# Patient Record
Sex: Female | Born: 1985 | Race: Black or African American | Hispanic: No | Marital: Married | Smoking: Never smoker
Health system: Southern US, Community
[De-identification: ages and names within clinical notes are randomized; demographics above are authoritative.]

## PROBLEM LIST (undated history)

## (undated) DIAGNOSIS — I10 Essential (primary) hypertension: Secondary | ICD-10-CM

## (undated) HISTORY — PX: HERNIA REPAIR: SHX51

## (undated) HISTORY — PX: TUBAL LIGATION: SHX77

## (undated) HISTORY — PX: HEMORROIDECTOMY: SUR656

---

## 2010-07-31 ENCOUNTER — Emergency Department (INDEPENDENT_AMBULATORY_CARE_PROVIDER_SITE_OTHER): Payer: Self-pay

## 2010-07-31 ENCOUNTER — Emergency Department (HOSPITAL_BASED_OUTPATIENT_CLINIC_OR_DEPARTMENT_OTHER)
Admission: EM | Admit: 2010-07-31 | Discharge: 2010-07-31 | Disposition: A | Discharge status: Home / Self Care | Payer: Self-pay | Attending: Emergency Medicine | Admitting: Emergency Medicine

## 2010-07-31 DIAGNOSIS — A499 Bacterial infection, unspecified: Secondary | ICD-10-CM | POA: Insufficient documentation

## 2010-07-31 DIAGNOSIS — B9689 Other specified bacterial agents as the cause of diseases classified elsewhere: Secondary | ICD-10-CM | POA: Insufficient documentation

## 2010-07-31 DIAGNOSIS — M549 Dorsalgia, unspecified: Secondary | ICD-10-CM

## 2010-07-31 DIAGNOSIS — R109 Unspecified abdominal pain: Secondary | ICD-10-CM | POA: Insufficient documentation

## 2010-07-31 DIAGNOSIS — N760 Acute vaginitis: Secondary | ICD-10-CM | POA: Insufficient documentation

## 2010-07-31 LAB — URINALYSIS, ROUTINE W REFLEX MICROSCOPIC
Bilirubin Urine: NEGATIVE
Hgb urine dipstick: NEGATIVE
Ketones, ur: NEGATIVE mg/dL
Specific Gravity, Urine: 1.029 (ref 1.005–1.030)
Urine Glucose, Fasting: NEGATIVE mg/dL
pH: 7.5 (ref 5.0–8.0)

## 2010-07-31 LAB — WET PREP, GENITAL
Trich, Wet Prep: NONE SEEN
Yeast Wet Prep HPF POC: NONE SEEN

## 2010-08-01 LAB — GC/CHLAMYDIA PROBE AMP, GENITAL
Chlamydia, DNA Probe: NEGATIVE
GC Probe Amp, Genital: NEGATIVE

## 2010-08-31 ENCOUNTER — Emergency Department (INDEPENDENT_AMBULATORY_CARE_PROVIDER_SITE_OTHER): Payer: Self-pay

## 2010-08-31 ENCOUNTER — Emergency Department (HOSPITAL_BASED_OUTPATIENT_CLINIC_OR_DEPARTMENT_OTHER)
Admission: EM | Admit: 2010-08-31 | Discharge: 2010-08-31 | Disposition: A | Discharge status: Home / Self Care | Payer: Self-pay | Attending: Emergency Medicine | Admitting: Emergency Medicine

## 2010-08-31 DIAGNOSIS — S5000XA Contusion of unspecified elbow, initial encounter: Secondary | ICD-10-CM | POA: Insufficient documentation

## 2010-08-31 DIAGNOSIS — M25539 Pain in unspecified wrist: Secondary | ICD-10-CM

## 2011-02-01 ENCOUNTER — Emergency Department (HOSPITAL_BASED_OUTPATIENT_CLINIC_OR_DEPARTMENT_OTHER)
Admission: EM | Admit: 2011-02-01 | Discharge: 2011-02-01 | Disposition: A | Discharge status: Home / Self Care | Payer: Self-pay | Attending: Emergency Medicine | Admitting: Emergency Medicine

## 2011-02-01 ENCOUNTER — Encounter: Payer: Self-pay | Admitting: *Deleted

## 2011-02-01 DIAGNOSIS — N390 Urinary tract infection, site not specified: Secondary | ICD-10-CM | POA: Insufficient documentation

## 2011-02-01 DIAGNOSIS — R109 Unspecified abdominal pain: Secondary | ICD-10-CM | POA: Insufficient documentation

## 2011-02-01 LAB — COMPREHENSIVE METABOLIC PANEL
ALT: 20 U/L (ref 0–35)
AST: 22 U/L (ref 0–37)
Alkaline Phosphatase: 52 U/L (ref 39–117)
CO2: 25 mEq/L (ref 19–32)
Chloride: 102 mEq/L (ref 96–112)
GFR calc Af Amer: >60 mL/min (ref >60)
GFR calc non Af Amer: >60 mL/min (ref >60)
Glucose, Bld: 93 mg/dL (ref 70–99)
Potassium: 3.2 mEq/L — ABNORMAL LOW (ref 3.5–5.1)
Sodium: 136 mEq/L (ref 135–145)
Total Bilirubin: 0.4 mg/dL (ref 0.3–1.2)

## 2011-02-01 LAB — CBC
MCV: 83.3 fL (ref 78.0–100.0)
Platelets: 205 10*3/uL (ref 150–400)
RBC: 3.9 MIL/uL (ref 3.87–5.11)
RDW: 12.4 % (ref 11.5–15.5)
WBC: 5.2 10*3/uL (ref 4.0–10.5)

## 2011-02-01 LAB — URINALYSIS, ROUTINE W REFLEX MICROSCOPIC
Hgb urine dipstick: NEGATIVE
Ketones, ur: NEGATIVE mg/dL
Nitrite: NEGATIVE
Specific Gravity, Urine: 1.036 — ABNORMAL HIGH (ref 1.005–1.030)
Urobilinogen, UA: 1 mg/dL (ref 0.0–1.0)
pH: 5.5 (ref 5.0–8.0)

## 2011-02-01 LAB — DIFFERENTIAL
Basophils Absolute: 0 10*3/uL (ref 0.0–0.1)
Lymphocytes Relative: 17 % (ref 12–46)
Lymphs Abs: 0.9 10*3/uL (ref 0.7–4.0)
Neutro Abs: 3.5 10*3/uL (ref 1.7–7.7)

## 2011-02-01 LAB — URINE MICROSCOPIC-ADD ON

## 2011-02-01 MED ORDER — SODIUM CHLORIDE 0.9 % IV BOLUS (SEPSIS)
1000.0000 mL | Freq: Once | INTRAVENOUS | Status: AC
  Administered 2011-02-01: 1000 mL via INTRAVENOUS

## 2011-02-01 MED ORDER — NITROFURANTOIN MONOHYD MACRO 100 MG PO CAPS: 100.0000 mg | ORAL_CAPSULE | Freq: Two times a day (BID) | ORAL | Status: AC

## 2011-02-01 MED ORDER — ONDANSETRON HCL 4 MG/2ML IJ SOLN: 4.0000 mg | Freq: Once | INTRAMUSCULAR | Status: DC

## 2011-02-01 MED ORDER — ACETAMINOPHEN 325 MG PO TABS
650.0000 mg | ORAL_TABLET | Freq: Once | ORAL | Status: AC
Start: 2011-02-01 — End: 2011-02-01
  Administered 2011-02-01: 650 mg via ORAL
  Filled 2011-02-01: qty 2

## 2011-02-01 NOTE — ED Provider Notes (Signed)
History     CSN: [id number] Arrival date & time: 02/01/2011 12:42 PM  Chief Complaint  Patient presents with  . Abdominal Pain    abdominal pain with diarrhea   Patient is a 25 y.o. female presenting with abdominal pain. The history is provided by the patient. No language interpreter was used.  Abdominal Pain The primary symptoms of the illness include abdominal pain, nausea and diarrhea. The primary symptoms of the illness do not include vomiting, dysuria, vaginal discharge or vaginal bleeding. The current episode started more than 2 days ago. The onset of the illness was gradual. The problem has not changed since onset. The patient states that she believes she is currently not pregnant. The patient has not had a change in bowel habit. Symptoms associated with the illness do not include diaphoresis, urgency, hematuria, frequency or back pain.    History reviewed. No pertinent past medical history.  History reviewed. No pertinent past surgical history.  Family History  Problem Relation Age of Onset  . Hyperlipidemia Mother     History  Substance Use Topics  . Smoking status: Former Smoker    Quit date: 06/16/2008  . Smokeless tobacco: Not on file  . Alcohol Use: Yes     occassionally    OB History    Grav Para Term Preterm Abortions TAB SAB Ect Mult Living   0 0              Review of Systems  Constitutional: Negative for diaphoresis.  Gastrointestinal: Positive for nausea, abdominal pain and diarrhea. Negative for vomiting.  Genitourinary: Negative for dysuria, urgency, frequency, hematuria, vaginal bleeding and vaginal discharge.  Musculoskeletal: Negative for back pain.  All other systems reviewed and are negative.    Physical Exam  BP 109/63  Pulse 77  Temp(Src) 99 F (37.2 C) (Oral)  Resp 20  Ht 5\' 5"  (1.651 m)  Wt 166 lb (75.297 kg)  BMI 27.62 kg/m2  SpO2 100%  LMP 01/15/2011  Physical Exam  Nursing note and vitals reviewed. Constitutional: She is  oriented to person, place, and time. She appears well-developed and well-nourished.  Eyes: Pupils are equal, round, and reactive to light.  Neck: Normal range of motion. Neck supple.  Cardiovascular: Normal rate and regular rhythm.   Pulmonary/Chest: Effort normal and breath sounds normal.  Abdominal: Soft. Bowel sounds are normal.  Musculoskeletal: Normal range of motion.  Neurological: She is alert and oriented to person, place, and time.  Skin: Skin is warm and dry.  Psychiatric: She has a normal mood and affect.    ED Course  Procedures Results for orders placed during the hospital encounter of 02/01/11  URINALYSIS, ROUTINE W REFLEX MICROSCOPIC      Component Value Range   Color, Urine YELLOW  YELLOW    Appearance CLOUDY (*) CLEAR    Specific Gravity, Urine 1.036 (*) 1.005 - 1.030    pH 5.5  5.0 - 8.0    Glucose, UA NEGATIVE  NEGATIVE (mg/dL)   Hgb urine dipstick NEGATIVE  NEGATIVE    Bilirubin Urine SMALL (*) NEGATIVE    Ketones, ur NEGATIVE  NEGATIVE (mg/dL)   Protein, ur NEGATIVE  NEGATIVE (mg/dL)   Urobilinogen, UA 1.0  0.0 - 1.0 (mg/dL)   Nitrite NEGATIVE  NEGATIVE    Leukocytes, UA LARGE (*) NEGATIVE   PREGNANCY, URINE      Component Value Range   Preg Test, Ur NEGATIVE    URINE MICROSCOPIC-ADD ON      Component  Value Range   Squamous Epithelial / LPF MANY (*) RARE    WBC, UA TOO NUMEROUS TO COUNT  <3 (WBC/hpf)   Bacteria, UA MANY (*) RARE    Urine-Other MUCOUS PRESENT    CBC      Component Value Range   WBC 5.2  4.0 - 10.5 (K/uL)   RBC 3.90  3.87 - 5.11 (MIL/uL)   Hemoglobin 11.3 (*) 12.0 - 15.0 (g/dL)   HCT 16.1 (*) 09.6 - 46.0 (%)   MCV 83.3  78.0 - 100.0 (fL)   MCH 29.0  26.0 - 34.0 (pg)   MCHC 34.8  30.0 - 36.0 (g/dL)   RDW 04.5  40.9 - 81.1 (%)   Platelets 205  150 - 400 (K/uL)  DIFFERENTIAL      Component Value Range   Neutrophils Relative 67  43 - 77 (%)   Neutro Abs 3.5  1.7 - 7.7 (K/uL)   Lymphocytes Relative 17  12 - 46 (%)   Lymphs Abs 0.9   0.7 - 4.0 (K/uL)   Monocytes Relative 16 (*) 3 - 12 (%)   Monocytes Absolute 0.8  0.1 - 1.0 (K/uL)   Eosinophils Relative 0  0 - 5 (%)   Eosinophils Absolute 0.0  0.0 - 0.7 (K/uL)   Basophils Relative 0  0 - 1 (%)   Basophils Absolute 0.0  0.0 - 0.1 (K/uL)  COMPREHENSIVE METABOLIC PANEL      Component Value Range   Sodium 136  135 - 145 (mEq/L)   Potassium 3.2 (*) 3.5 - 5.1 (mEq/L)   Chloride 102  96 - 112 (mEq/L)   CO2 25  19 - 32 (mEq/L)   Glucose, Bld 93  70 - 99 (mg/dL)   BUN 12  6 - 23 (mg/dL)   Creatinine, Ser 9.14  0.50 - 1.10 (mg/dL)   Calcium 9.7  8.4 - 78.2 (mg/dL)   Total Protein 7.5  6.0 - 8.3 (g/dL)   Albumin 3.9  3.5 - 5.2 (g/dL)   AST 22  0 - 37 (U/L)   ALT 20  0 - 35 (U/L)   Alkaline Phosphatase 52  39 - 117 (U/L)   Total Bilirubin 0.4  0.3 - 1.2 (mg/dL)   GFR calc non Af Amer >60  >60 (mL/min)   GFR calc Af Amer >60  >60 (mL/min)  LIPASE, BLOOD      Component Value Range   Lipase 19  11 - 59 (U/L)     MDM Will treat pt for a uti with possible pyelo      Teressa Lower, NP 02/01/11 1534

## 2011-02-01 NOTE — ED Notes (Signed)
Upper abdominal pain for 4 days, describes as cramping, associated with diarrhea and decreased appetite.

## 2011-02-02 NOTE — ED Provider Notes (Signed)
History/physical exam/procedure(s) were performed by non-physician practitioner and as supervising physician I was immediately available for consultation/collaboration. I have reviewed all notes and am in agreement with care and plan.   Hilario Quarry, MD 02/02/11 [phone]

## 2014-06-01 ENCOUNTER — Emergency Department (HOSPITAL_BASED_OUTPATIENT_CLINIC_OR_DEPARTMENT_OTHER)
Admission: EM | Admit: 2014-06-01 | Discharge: 2014-06-01 | Disposition: A | Discharge status: Home / Self Care | Payer: Medicaid Other | Attending: Emergency Medicine | Admitting: Emergency Medicine

## 2014-06-01 ENCOUNTER — Encounter (HOSPITAL_BASED_OUTPATIENT_CLINIC_OR_DEPARTMENT_OTHER): Payer: Self-pay | Admitting: *Deleted

## 2014-06-01 DIAGNOSIS — M6208 Separation of muscle (nontraumatic), other site: Secondary | ICD-10-CM | POA: Diagnosis not present

## 2014-06-01 DIAGNOSIS — Z3202 Encounter for pregnancy test, result negative: Secondary | ICD-10-CM | POA: Insufficient documentation

## 2014-06-01 DIAGNOSIS — R109 Unspecified abdominal pain: Secondary | ICD-10-CM | POA: Diagnosis present

## 2014-06-01 DIAGNOSIS — Z87891 Personal history of nicotine dependence: Secondary | ICD-10-CM | POA: Insufficient documentation

## 2014-06-01 LAB — URINALYSIS, ROUTINE W REFLEX MICROSCOPIC
Bilirubin Urine: NEGATIVE
Glucose, UA: NEGATIVE mg/dL
Hgb urine dipstick: NEGATIVE
Ketones, ur: NEGATIVE mg/dL
NITRITE: NEGATIVE
PH: 5.5 (ref 5.0–8.0)
Protein, ur: NEGATIVE mg/dL
SPECIFIC GRAVITY, URINE: 1.025 (ref 1.005–1.030)
UROBILINOGEN UA: 0.2 mg/dL (ref 0.0–1.0)

## 2014-06-01 LAB — URINE MICROSCOPIC-ADD ON

## 2014-06-01 LAB — PREGNANCY, URINE: PREG TEST UR: NEGATIVE

## 2014-06-01 MED ORDER — NAPROXEN 500 MG PO TABS: 500.0000 mg | ORAL_TABLET | Freq: Two times a day (BID) | ORAL | Status: DC

## 2014-06-01 NOTE — Discharge Instructions (Signed)
Please read and follow all provided instructions.  Your diagnoses today include:  1. Diastasis recti    Tests performed today include:  Vital signs. See below for your results today.   Medications prescribed:   Naproxen - anti-inflammatory pain medication  Do not exceed 500mg  naproxen every 12 hours, take with food  You have been prescribed an anti-inflammatory medication or NSAID. Take with food. Take smallest effective dose for the shortest duration needed for your pain. Stop taking if you experience stomach pain or vomiting.   Take any prescribed medications only as directed.  Home care instructions:  Follow any educational materials contained in this packet.  Follow-up instructions: Please follow-up with your primary care provider or see the surgery referral listed in the next 1-2 weeks for further evaluation of your symptoms.   Return instructions:   Please return to the Emergency Department if you experience worsening symptoms.   Return with worsening severe abdominal pain, vomiting.  Please return if you have any other emergent concerns.  Additional Information:  Your vital signs today were: BP 135/89 mmHg   Pulse 72   Temp(Src) 98.7 F (37.1 C) (Oral)   Resp 20   Ht 5\' 5"  (1.651 m)   Wt 198 lb (89.812 kg)   BMI 32.95 kg/m2   SpO2 99%   LMP 04/17/2014 If your blood pressure (BP) was elevated above 135/85 this visit, please have this repeated by your doctor within one month. --------------

## 2014-06-01 NOTE — ED Notes (Signed)
Abdominal pain off and on for a month. States she has a lump in her stomach.

## 2014-06-01 NOTE — ED Provider Notes (Signed)
CSN: [id number]     Arrival date & time 06/01/14  1846 History   First MD Initiated Contact with Patient 06/01/14 1900     Chief Complaint  Patient presents with  . Abdominal Pain     (Consider location/radiation/quality/duration/timing/severity/associated sxs/prior Treatment) HPI Comments: Patient who is 3 and half months postpartum presents with complaint of abdominal swelling and pain. Patient first noted swelling above her belly button after she gave birth to her child. She told this to her doctor and was told that her muscles were stretched and not to worry about it. She feels that the area has become larger. Over the past month she has had intermittent, not daily, dull aching pain to the area. No fever, nausea, vomiting, or diarrhea. No urinary symptoms. No treatments prior to arrival.   Patient is a 28 y.o. female presenting with abdominal pain. The history is provided by the patient.  Abdominal Pain Associated symptoms: no chest pain, no cough, no diarrhea, no dysuria, no fever, no nausea, no sore throat and no vomiting     History reviewed. No pertinent past medical history. Past Surgical History  Procedure Laterality Date  . Tubal ligation     Family History  Problem Relation Age of Onset  . Hyperlipidemia Mother    History  Substance Use Topics  . Smoking status: Former Smoker    Quit date: 06/16/2008  . Smokeless tobacco: Not on file  . Alcohol Use: Yes     Comment: occassionally   OB History    Gravida Para Term Preterm AB TAB SAB Ectopic Multiple Living   0 0             Review of Systems  Constitutional: Negative for fever.  HENT: Negative for rhinorrhea and sore throat.   Eyes: Negative for redness.  Respiratory: Negative for cough.   Cardiovascular: Negative for chest pain.  Gastrointestinal: Positive for abdominal pain and abdominal distention. Negative for nausea, vomiting and diarrhea.  Genitourinary: Negative for dysuria.  Musculoskeletal: Negative  for myalgias.  Skin: Negative for rash.  Neurological: Negative for headaches.      Allergies  Review of patient's allergies indicates no known allergies.  Home Medications   Prior to Admission medications   Not on File   BP 135/89 mmHg  Pulse 72  Temp(Src) 98.7 F (37.1 C) (Oral)  Resp 20  Ht 5\' 5"  (1.651 m)  Wt 198 lb (89.812 kg)  BMI 32.95 kg/m2  SpO2 99%  LMP 04/17/2014   Physical Exam  Constitutional: She appears well-developed and well-nourished.  HENT:  Head: Normocephalic and atraumatic.  Eyes: Conjunctivae are normal. Right eye exhibits no discharge. Left eye exhibits no discharge.  Neck: Normal range of motion. Neck supple.  Cardiovascular: Normal rate, regular rhythm and normal heart sounds.   Pulmonary/Chest: Effort normal and breath sounds normal.  Abdominal: Soft. There is no tenderness. There is no rebound and no guarding.  Patient has a bulge superior to the umbilicus which appears most consistent with diastases recti. This is worse with patient standing and with her lifting her shoulders up off the bed while lying flat. When she relaxes there is a small protrusion in the right upper abdomen which is readily reduced.  Neurological: She is alert.  Skin: Skin is warm and dry.  Psychiatric: She has a normal mood and affect.  Nursing note and vitals reviewed.   ED Course  Procedures (including critical care time) Labs Review Labs Reviewed  URINALYSIS, ROUTINE W REFLEX  MICROSCOPIC - Abnormal; Notable for the following:    APPearance CLOUDY (*)    Leukocytes, UA LARGE (*)    All other components within normal limits  URINE MICROSCOPIC-ADD ON - Abnormal; Notable for the following:    Squamous Epithelial / LPF FEW (*)    Bacteria, UA FEW (*)    All other components within normal limits  PREGNANCY, URINE    Imaging Review No results found.   EKG Interpretation None       7:35 PM Patient seen and examined.   Vital signs reviewed and are as  follows: BP 135/89 mmHg  Pulse 72  Temp(Src) 98.7 F (37.1 C) (Oral)  Resp 20  Ht 5\' 5"  (1.651 m)  Wt 198 lb (89.812 kg)  BMI 32.95 kg/m2  SpO2 99%  LMP 04/17/2014  Patient counseled on conservative management.   I have given the patient a surgery referral so that she can discuss management and to r/o hernia.   We discussed signs and symptoms of incarceration and strangulation which should cause her to return to the emergency department.  MDM   Final diagnoses:  Diastasis recti   Suspect post-partum diastasis recti based on exam, however I cannot completely dismiss a small ventral hernia on the right side. There are no signs of strangulation or incarceration. Shehas no tenderness on exam at this time. No signs of obstruction. Feel conservative management with appropriate follow-up as indicated.  Asymptomatic pyuria noted, unrelated to current complaint.   Renne CriglerJoshua Kamorie Aldous, PA-C 06/01/14 1938  Richardean Canalavid H Yao, MD 06/04/14 [phone]

## 2014-06-01 NOTE — ED Notes (Signed)
C/o abd pain off and on x 1 month  w knot in abd

## 2016-12-11 ENCOUNTER — Encounter (HOSPITAL_BASED_OUTPATIENT_CLINIC_OR_DEPARTMENT_OTHER): Payer: Self-pay | Admitting: Emergency Medicine

## 2016-12-11 ENCOUNTER — Emergency Department (HOSPITAL_BASED_OUTPATIENT_CLINIC_OR_DEPARTMENT_OTHER)
Admission: EM | Admit: 2016-12-11 | Discharge: 2016-12-11 | Disposition: A | Discharge status: Home / Self Care | Payer: Medicaid Other | Attending: Emergency Medicine | Admitting: Emergency Medicine

## 2016-12-11 DIAGNOSIS — K047 Periapical abscess without sinus: Secondary | ICD-10-CM | POA: Insufficient documentation

## 2016-12-11 DIAGNOSIS — Z87891 Personal history of nicotine dependence: Secondary | ICD-10-CM | POA: Insufficient documentation

## 2016-12-11 MED ORDER — CLINDAMYCIN HCL 150 MG PO CAPS: 450.0000 mg | ORAL_CAPSULE | Freq: Four times a day (QID) | ORAL | 0 refills | Status: DC

## 2016-12-11 MED ORDER — NAPROXEN 500 MG PO TABS: 500.0000 mg | ORAL_TABLET | Freq: Two times a day (BID) | ORAL | 0 refills | Status: DC

## 2016-12-11 NOTE — ED Notes (Signed)
ED Provider at bedside. 

## 2016-12-11 NOTE — ED Provider Notes (Signed)
[facility]   CSN: [id number] Arrival date & time: 12/11/16  0745     History   Chief Complaint Chief Complaint  Patient presents with  . Abscess    HPI Jamie Christensen is a 31 y.o. female.  HPI  Pt comes in with cc of abscess. Pt reports that she developed abscess to her gums few days back. 2 days ago the lesion drained pus, but seems the drainage was not complete. Pt has hx of dental problems. She doesn't recall any specific trauma to the gums /tooth. Pt is healthy and not immunocompromised. Pt has no n/v/f/c. Pt is to see dentist next week.  History reviewed. No pertinent past medical history.  There are no active problems to display for this patient.   Past Surgical History:  Procedure Laterality Date  . HERNIA REPAIR    . TUBAL LIGATION      OB History    Gravida Para Term Preterm AB Living   0 0           SAB TAB Ectopic Multiple Live Births                   Home Medications    Prior to Admission medications   Medication Sig Start Date End Date Taking? Authorizing Provider  clindamycin (CLEOCIN) 150 MG capsule Take 3 capsules (450 mg total) by mouth 4 (four) times daily. 12/11/16   Derwood Kaplan, MD  naproxen (NAPROSYN) 500 MG tablet Take 1 tablet (500 mg total) by mouth 2 (two) times daily. 12/11/16   Derwood Kaplan, MD    Family History Family History  Problem Relation Age of Onset  . Hyperlipidemia Mother     Social History Social History  Substance Use Topics  . Smoking status: Former Smoker    Quit date: 06/16/2008  . Smokeless tobacco: Never Used  . Alcohol use Yes     Comment: occassionally     Allergies   Patient has no known allergies.   Review of Systems Review of Systems  HENT: Positive for dental problem.   Skin: Positive for rash.     Physical Exam Updated Vital Signs BP 135/86 (BP Location: Left Arm)   Pulse 80   Temp 98.8 F (37.1 C) (Oral)   Resp 18   Ht 5\' 5"  (1.651 m)   Wt 79.8 kg  (176 lb)   LMP 11/16/2016   SpO2 100%   BMI 29.29 kg/m   Physical Exam  Constitutional: She is oriented to person, place, and time. She appears well-developed.  HENT:  Head: Normocephalic and atraumatic.  The gingiva overlying teeth 5-7 has erythema and fluctuance.   Eyes: EOM are normal.  Neck: Normal range of motion. Neck supple.  Cardiovascular: Normal rate.   Pulmonary/Chest: Effort normal.  Abdominal: Bowel sounds are normal.  Neurological: She is alert and oriented to person, place, and time.  Skin: Skin is warm and dry.  Nursing note and vitals reviewed.    ED Treatments / Results  Labs (all labs ordered are listed, but only abnormal results are displayed) Labs Reviewed - No data to display  EKG  EKG Interpretation None       Radiology No results found.  Procedures Procedures (including critical care time)  Medications Ordered in ED Medications - No data to display   Initial Impression / Assessment and Plan / ED Course  I have reviewed the triage vital signs and the nursing notes.  Pertinent labs & imaging  results that were available during my care of the patient were reviewed by me and considered in my medical decision making (see chart for details).     Pt comes in with abscess to the gums. I am not convinced that the etiology is dental in nature - but I dont think her seeing a dentist next week would be detrimental either.  Pt has a small area of fluctuance within the gingival erythema between tooth 5-7. It seems like the drainage that occurred on Tuesday spontaneously has helped reduce the amount of fluctuance. We gave the patient the option for us to put in a small cut with a blade and start antibiotics vs. Antibiotics alone and for her to return if the symptoms get worse. Pt chose the latter. WE will start her on clinda given there is an abscess.   Final Clinical Impressions(s) / ED Diagnoses   Final diagnoses:  Dental abscess    New  Prescriptions Discharge Medication List as of 12/11/2016  8:35 AM    START taking these medications   Details  clindamycin (CLEOCIN) 150 MG capsule Take 3 capsules (450 mg total) by mouth 4 (four) times daily., Starting Thu 12/11/2016, Print         Derwood KaplanNanavati, Jelina Paulsen, MD 12/11/16 [phone]

## 2016-12-11 NOTE — ED Triage Notes (Signed)
Abscess to inside of upper lip. Pt reports it drained on Tuesday but pain persists.

## 2016-12-11 NOTE — Discharge Instructions (Signed)
You have a dental abscess, and it has affected the gums. We recommend taking the antibiotics prescribed and seeing a dentist next week. Return to the ER if the abscess is getting larger. Maintain good oral hygiene for now.

## 2017-02-13 ENCOUNTER — Emergency Department (HOSPITAL_BASED_OUTPATIENT_CLINIC_OR_DEPARTMENT_OTHER)
Admission: EM | Admit: 2017-02-13 | Discharge: 2017-02-13 | Disposition: A | Discharge status: Home / Self Care | Payer: Medicaid Other | Attending: Emergency Medicine | Admitting: Emergency Medicine

## 2017-02-13 ENCOUNTER — Encounter (HOSPITAL_BASED_OUTPATIENT_CLINIC_OR_DEPARTMENT_OTHER): Payer: Self-pay | Admitting: Emergency Medicine

## 2017-02-13 DIAGNOSIS — R51 Headache: Secondary | ICD-10-CM

## 2017-02-13 DIAGNOSIS — Z87891 Personal history of nicotine dependence: Secondary | ICD-10-CM | POA: Insufficient documentation

## 2017-02-13 DIAGNOSIS — I10 Essential (primary) hypertension: Secondary | ICD-10-CM | POA: Insufficient documentation

## 2017-02-13 DIAGNOSIS — Z79899 Other long term (current) drug therapy: Secondary | ICD-10-CM | POA: Insufficient documentation

## 2017-02-13 DIAGNOSIS — R519 Headache, unspecified: Secondary | ICD-10-CM

## 2017-02-13 HISTORY — DX: Essential (primary) hypertension: I10

## 2017-02-13 NOTE — ED Triage Notes (Signed)
Patient states that she has been on new BP medications x 1 month. Today she felt dizzy and lightheaded and had a HA so she took her BP and it was elevated

## 2017-02-13 NOTE — Discharge Instructions (Signed)
Please read and follow all provided instructions.  Your diagnoses today include:  1. Hypertension, unspecified type   2. Acute nonintractable headache, unspecified headache type     Tests performed today include: Vital signs. See below for your results today.   Medications prescribed:  Take as prescribed   Home care instructions:  Follow any educational materials contained in this packet.  Follow-up instructions: Please follow-up with your primary care provider for further evaluation of symptoms and treatment   Return instructions:  Please return to the Emergency Department if you do not get better, if you get worse, or new symptoms OR  - Fever (temperature greater than 101.33F)  - Bleeding that does not stop with holding pressure to the area    -Severe pain (please note that you may be more sore the day after your accident)  - Chest Pain  - Difficulty breathing  - Severe nausea or vomiting  - Inability to tolerate food and liquids  - Passing out  - Skin becoming red around your wounds  - Change in mental status (confusion or lethargy)  - New numbness or weakness    Please return if you have any other emergent concerns.  Additional Information:  Your vital signs today were: BP (!) 126/93    Pulse (!) 59    Temp 98.7 F (37.1 C) (Oral)    Resp 13    Ht 5\' 5"  (1.651 m)    Wt 81.6 kg (180 lb)    LMP 02/13/2017    SpO2 100%    BMI 29.95 kg/m  If your blood pressure (BP) was elevated above 135/85 this visit, please have this repeated by your doctor within one month. ---------------

## 2017-02-13 NOTE — ED Provider Notes (Signed)
[facility] Note   CSN: [id number] Arrival date & time: 02/13/17  1056     History   Chief Complaint Chief Complaint  Patient presents with  . Headache    HPI Jamie Christensen is a 31 y.o. female.  HPI  31 y.o. female with a hx of HTN, presents to the Emergency Department today due to headache with dizziness. Notes being diagnosed with HTN x 1 month ago and started on BP medication (Lisinopril 5mg ). Pt states she noticed BP 140/100s early this morning. Associated headache. Took BP med. Pt mother wanted pt to come to ED for evaluation. In ED. BP 126/93. Headache resolving. No numbness/tingling. No weakness. No dizziness. No CP/SOB. No N/V. No pain currently. No fevers. No neck stiffness. No other symptoms noted.    Past Medical History:  Diagnosis Date  . Hypertension     There are no active problems to display for this patient.   Past Surgical History:  Procedure Laterality Date  . HERNIA REPAIR    . TUBAL LIGATION      OB History    Gravida Para Term Preterm AB Living   0 0           SAB TAB Ectopic Multiple Live Births                   Home Medications    Prior to Admission medications   Medication Sig Start Date End Date Taking? Authorizing Provider  lisinopril (PRINIVIL,ZESTRIL) 20 MG tablet Take 5 mg by mouth daily.   Yes [provider]  clindamycin (CLEOCIN) 150 MG capsule Take 3 capsules (450 mg total) by mouth 4 (four) times daily. 12/11/16   Derwood KaplanNanavati, Ankit, MD  naproxen (NAPROSYN) 500 MG tablet Take 1 tablet (500 mg total) by mouth 2 (two) times daily. 12/11/16   Derwood KaplanNanavati, Ankit, MD    Family History Family History  Problem Relation Age of Onset  . Hyperlipidemia Mother     Social History Social History  Substance Use Topics  . Smoking status: Former Smoker    Quit date: 06/16/2008  . Smokeless tobacco: Never Used  . Alcohol use Yes     Comment: occassionally     Allergies   Patient has no known  allergies.   Review of Systems Review of Systems ROS reviewed and all are negative for acute change except as noted in the HPI.  Physical Exam Updated Vital Signs BP (!) 141/101 (BP Location: Left Arm)   Pulse 72   Temp 98.7 F (37.1 C) (Oral)   Resp 18   Ht 5\' 5"  (1.651 m)   Wt 81.6 kg (180 lb)   LMP 02/13/2017   SpO2 100%   BMI 29.95 kg/m   Physical Exam  Constitutional: She is oriented to person, place, and time. Vital signs are normal. She appears well-developed and well-nourished. No distress.  HENT:  Head: Normocephalic and atraumatic.  Right Ear: Hearing, tympanic membrane, external ear and ear canal normal.  Left Ear: Hearing, tympanic membrane, external ear and ear canal normal.  Nose: Nose normal.  Mouth/Throat: Uvula is midline, oropharynx is clear and moist and mucous membranes are normal. No trismus in the jaw. No oropharyngeal exudate, posterior oropharyngeal erythema or tonsillar abscesses.  Eyes: Pupils are equal, round, and reactive to light. Conjunctivae and EOM are normal.  Neck: Normal range of motion. Neck supple. No tracheal deviation present.  Cardiovascular: Normal rate, regular rhythm, S1 normal, S2 normal, normal heart sounds, intact  distal pulses and normal pulses.   Pulmonary/Chest: Effort normal and breath sounds normal. No respiratory distress. She has no decreased breath sounds. She has no wheezes. She has no rhonchi. She has no rales.  Abdominal: Normal appearance and bowel sounds are normal. There is no tenderness.  Musculoskeletal: Normal range of motion.  Neurological: She is alert and oriented to person, place, and time. She has normal strength. No cranial nerve deficit or sensory deficit.  Cranial Nerves:  II: Pupils equal, round, reactive to light III,IV, VI: ptosis not present, extra-ocular motions intact bilaterally  V,VII: smile symmetric, facial light touch sensation equal VIII: hearing grossly normal bilaterally  IX,X: midline uvula  rise  XI: bilateral shoulder shrug equal and strong XII: midline tongue extension  Skin: Skin is warm and dry.  Psychiatric: She has a normal mood and affect. Her speech is normal and behavior is normal. Thought content normal.  Nursing note and vitals reviewed.    ED Treatments / Results  Labs (all labs ordered are listed, but only abnormal results are displayed) Labs Reviewed - No data to display  EKG  EKG Interpretation None       Radiology No results found.  Procedures Procedures (including critical care time)  Medications Ordered in ED Medications - No data to display   Initial Impression / Assessment and Plan / ED Course  I have reviewed the triage vital signs and the nursing notes.  Pertinent labs & imaging results that were available during my care of the patient were reviewed by me and considered in my medical decision making (see chart for details).  Final Clinical Impressions(s) / ED Diagnoses     {I have reviewed the relevant previous healthcare records.  {I obtained HPI from historian.   ED Course:  Assessment: Pt is a 31 y.o. female with a hx of HTN, presents to the Emergency Department today due to headache with dizziness. Notes being diagnosed with HTN x 1 month ago and started on BP medication (Lisinopril 5mg ). Pt states she noticed BP 140/100s early this morning. Associated headache. Took BP med. Pt mother wanted pt to come to ED for evaluation. In ED. BP 126/93. Headache resolving. No numbness/tingling. No weakness. No dizziness. No CP/SOB. No N/V. No pain currently. No fevers. No neck stiffness. . On exam, pt in NAD. Nontoxic/nonseptic appearing. VSS. Afebrile. Lungs CTA. Heart RRR. Abdomen nontender soft. BP controlledCounseled pt to wait for BP med to take effect. Encouraged close follow up to PCP. Log BPs. Increase exercise. Counseled on DASH diet.. Plan is to DC home with follow up to PCP. At time of discharge, Patient is in no acute distress. Vital  Signs are stable. Patient is able to ambulate. Patient able to tolerate PO.   Disposition/Plan:  DC Home Additional Verbal discharge instructions given and discussed with patient.  Pt Instructed to f/u with PCP in the next week for evaluation and treatment of symptoms. Return precautions given Pt acknowledges and agrees with plan  Supervising Physician Raeford Razor, MD  Final diagnoses:  Hypertension, unspecified type  Acute nonintractable headache, unspecified headache type    New Prescriptions New Prescriptions   No medications on file     Audry Pili, Cordelia Poche 02/13/17 1153    Raeford Razor, MD 02/18/17 [phone]

## 2017-03-27 ENCOUNTER — Emergency Department (HOSPITAL_BASED_OUTPATIENT_CLINIC_OR_DEPARTMENT_OTHER)
Admission: EM | Admit: 2017-03-27 | Discharge: 2017-03-27 | Disposition: A | Discharge status: Home / Self Care | Payer: Self-pay | Attending: Emergency Medicine | Admitting: Emergency Medicine

## 2017-03-27 ENCOUNTER — Encounter (HOSPITAL_BASED_OUTPATIENT_CLINIC_OR_DEPARTMENT_OTHER): Payer: Self-pay | Admitting: Emergency Medicine

## 2017-03-27 ENCOUNTER — Emergency Department (HOSPITAL_COMMUNITY)
Admission: EM | Admit: 2017-03-27 | Discharge: 2017-03-28 | Disposition: A | Discharge status: Home / Self Care | Payer: No Typology Code available for payment source | Attending: Emergency Medicine | Admitting: Emergency Medicine

## 2017-03-27 ENCOUNTER — Emergency Department (HOSPITAL_BASED_OUTPATIENT_CLINIC_OR_DEPARTMENT_OTHER): Payer: Self-pay

## 2017-03-27 ENCOUNTER — Emergency Department (HOSPITAL_COMMUNITY): Payer: No Typology Code available for payment source

## 2017-03-27 DIAGNOSIS — R109 Unspecified abdominal pain: Secondary | ICD-10-CM | POA: Diagnosis not present

## 2017-03-27 DIAGNOSIS — Z87891 Personal history of nicotine dependence: Secondary | ICD-10-CM | POA: Insufficient documentation

## 2017-03-27 DIAGNOSIS — I10 Essential (primary) hypertension: Secondary | ICD-10-CM | POA: Diagnosis not present

## 2017-03-27 DIAGNOSIS — R0789 Other chest pain: Secondary | ICD-10-CM | POA: Insufficient documentation

## 2017-03-27 DIAGNOSIS — Y92410 Unspecified street and highway as the place of occurrence of the external cause: Secondary | ICD-10-CM | POA: Diagnosis not present

## 2017-03-27 DIAGNOSIS — Y9389 Activity, other specified: Secondary | ICD-10-CM | POA: Insufficient documentation

## 2017-03-27 DIAGNOSIS — Z79899 Other long term (current) drug therapy: Secondary | ICD-10-CM | POA: Diagnosis not present

## 2017-03-27 DIAGNOSIS — Y999 Unspecified external cause status: Secondary | ICD-10-CM | POA: Diagnosis not present

## 2017-03-27 DIAGNOSIS — M542 Cervicalgia: Secondary | ICD-10-CM | POA: Diagnosis not present

## 2017-03-27 DIAGNOSIS — R0602 Shortness of breath: Secondary | ICD-10-CM | POA: Insufficient documentation

## 2017-03-27 DIAGNOSIS — R079 Chest pain, unspecified: Secondary | ICD-10-CM | POA: Insufficient documentation

## 2017-03-27 LAB — TROPONIN I: Troponin I: <0.03 ng/mL (ref <0.03)

## 2017-03-27 LAB — I-STAT CHEM 8, ED
BUN: 12 mg/dL (ref 6–20)
Calcium, Ion: 1.16 mmol/L (ref 1.15–1.40)
Chloride: 104 mmol/L (ref 101–111)
Creatinine, Ser: 0.8 mg/dL (ref 0.44–1.00)
Glucose, Bld: 92 mg/dL (ref 65–99)
HEMATOCRIT: 39 % (ref 36.0–46.0)
HEMOGLOBIN: 13.3 g/dL (ref 12.0–15.0)
POTASSIUM: 3.6 mmol/L (ref 3.5–5.1)
Sodium: 140 mmol/L (ref 135–145)
TCO2: 25 mmol/L (ref 22–32)

## 2017-03-27 LAB — BASIC METABOLIC PANEL
ANION GAP: 6 (ref 5–15)
BUN: 12 mg/dL (ref 6–20)
CHLORIDE: 106 mmol/L (ref 101–111)
CO2: 25 mmol/L (ref 22–32)
Calcium: 9 mg/dL (ref 8.9–10.3)
Creatinine, Ser: 0.85 mg/dL (ref 0.44–1.00)
GFR calc non Af Amer: >60 mL/min (ref >60)
Glucose, Bld: 87 mg/dL (ref 65–99)
POTASSIUM: 3.6 mmol/L (ref 3.5–5.1)
Sodium: 137 mmol/L (ref 135–145)

## 2017-03-27 LAB — CBC
HEMATOCRIT: 37.8 % (ref 36.0–46.0)
HEMOGLOBIN: 12.8 g/dL (ref 12.0–15.0)
MCH: 28.7 pg (ref 26.0–34.0)
MCHC: 33.9 g/dL (ref 30.0–36.0)
MCV: 84.8 fL (ref 78.0–100.0)
Platelets: 228 10*3/uL (ref 150–400)
RBC: 4.46 MIL/uL (ref 3.87–5.11)
RDW: 12.5 % (ref 11.5–15.5)
WBC: 5.6 10*3/uL (ref 4.0–10.5)

## 2017-03-27 LAB — I-STAT BETA HCG BLOOD, ED (MC, WL, AP ONLY)

## 2017-03-27 LAB — D-DIMER, QUANTITATIVE: D-Dimer, Quant: <0.27 ug/mL-FEU (ref 0.00–0.50)

## 2017-03-27 LAB — CBG MONITORING, ED: Glucose-Capillary: 83 mg/dL (ref 65–99)

## 2017-03-27 LAB — PREGNANCY, URINE: Preg Test, Ur: NEGATIVE

## 2017-03-27 MED ORDER — ASPIRIN 81 MG PO CHEW
324.0000 mg | CHEWABLE_TABLET | Freq: Once | ORAL | Status: AC
  Administered 2017-03-27: 324 mg via ORAL
  Filled 2017-03-27: qty 4

## 2017-03-27 MED ORDER — IOPAMIDOL (ISOVUE-300) INJECTION 61%
INTRAVENOUS | Status: AC
  Administered 2017-03-27: 100 mL
  Filled 2017-03-27: qty 100

## 2017-03-27 MED ORDER — MELOXICAM 15 MG PO TABS: 15.0000 mg | ORAL_TABLET | Freq: Every day | ORAL | 0 refills | Status: DC

## 2017-03-27 MED ORDER — SODIUM CHLORIDE 0.9 % IV BOLUS (SEPSIS)
500.0000 mL | Freq: Once | INTRAVENOUS | Status: AC
  Administered 2017-03-27: 500 mL via INTRAVENOUS

## 2017-03-27 MED ORDER — ONDANSETRON HCL 4 MG/2ML IJ SOLN
4.0000 mg | Freq: Once | INTRAMUSCULAR | Status: DC
  Filled 2017-03-27: qty 2

## 2017-03-27 MED ORDER — TRAMADOL HCL 50 MG PO TABS: 50.0000 mg | ORAL_TABLET | Freq: Four times a day (QID) | ORAL | 0 refills | Status: DC | PRN

## 2017-03-27 MED ORDER — ONDANSETRON 4 MG PO TBDP: ORAL_TABLET | ORAL | 0 refills | Status: AC

## 2017-03-27 NOTE — ED Notes (Signed)
Bed: WA04 Expected date:  Expected time:  Means of arrival:  Comments: Triage 1 

## 2017-03-27 NOTE — ED Triage Notes (Signed)
Left sided chest pain for one week.    Some sob at times.  No diaphoresis or N/V.  Pain feels like throbbing, non-radiating.

## 2017-03-27 NOTE — ED Provider Notes (Signed)
[facility]   CSN: [id number] Arrival date & time: 03/27/17  4098     History   Chief Complaint Chief Complaint  Patient presents with  . Chest Pain    HPI Jamie Christensen is a 31 y.o. female.He presents emergency Department with chief complaint of left-sided chest pain. It has been ongoing for 1 week. She states that it is worse with certain movements such as twisting her thorax to the left, or sitting up from a lying position. She has associated shortness of breath which she feels is related to pain with deep breathing. She denies hemoptysis, unilateral leg swelling or pain, recent surgeries or confinement, or use any exogenous estrogens. She does not smoke. She denies having cough, fevers or chills. 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 She does not recall any specific event which might have caused injury. She denies recent viral illnesses, palpitations.  HPI  Past Medical History:  Diagnosis Date  . Hypertension     There are no active problems to display for this patient.   Past Surgical History:  Procedure Laterality Date  . HERNIA REPAIR    . TUBAL LIGATION      OB History    Gravida Para Term Preterm AB Living   0 0           SAB TAB Ectopic Multiple Live Births                   Home Medications    Prior to Admission medications   Medication Sig Start Date End Date Taking? Authorizing Provider  lisinopril (PRINIVIL,ZESTRIL) 20 MG tablet Take 5 mg by mouth daily.    [provider]    Family History Family History  Problem Relation Age of Onset  . Hyperlipidemia Mother     Social History Social History  Substance Use Topics  . Smoking status: Former Smoker    Quit date: 06/16/2008  . Smokeless tobacco: Never Used  . Alcohol use Yes     Comment: occassionally     Allergies   Patient has no known allergies.   Review of Systems Review of Systems Physical Exam  Nursing note and vitals  reviewed. Constitutional: She is oriented to person, place, and time. She appears well-developed and well-nourished. No distress.  HENT:  Head: Normocephalic and atraumatic.  Eyes: Conjunctivae normal and EOM are normal. Pupils are equal, round, and reactive to light. No scleral icterus.  Neck: Normal range of motion.  Cardiovascular: Normal rate, regular rhythm and normal heart sounds.  Exam reveals no gallop and no friction rub.   No murmur heard. Pulmonary/Chest: Effort normal and breath sounds normal. No respiratory distress.  no reproducible chest tenderness. Abdominal: Soft. Bowel sounds are normal. She exhibits no distension and no mass. There is no tenderness. There is no guarding.  Neurological: She is alert and oriented to person, place, and time.  Skin: Skin is warm and dry. She is not diaphoretic.     Physical Exam Updated Vital Signs BP 120/80 (BP Location: Left Arm)   Pulse 69   Temp 98.4 F (36.9 C)   Resp 16   Ht  (1.651 m)   Wt 81.6 kg (180 lb)   LMP 03/09/2017   SpO2 100%   BMI 29.95 kg/m   Physical Exam   ED Treatments / Results  Labs (all labs ordered are listed, but only abnormal results are displayed) Labs Reviewed  BASIC METABOLIC  PANEL  TROPONIN I  CBC  PREGNANCY, URINE  D-DIMER, QUANTITATIVE (NOT AT [facility])  TROPONIN I  CBG MONITORING, ED    EKG  EKG Interpretation  Date/Time:  Friday March 27 2017 09:15:10 EDT Ventricular Rate:  80 PR Interval:  156 QRS Duration: 78 QT Interval:  376 QTC Calculation: 433 R Axis:   26 Text Interpretation:  Normal sinus rhythm Normal ECG since last tracing no significant change Confirmed by Rolan Bucco [phone]) on 03/27/2017 10:37:22 AM       Radiology Dg Chest 2 View  Result Date: 03/27/2017 CLINICAL DATA:  Left side chest pain for 1 week EXAM: CHEST  2 VIEW COMPARISON:  None. FINDINGS: Heart and mediastinal contours are within normal limits. No focal opacities or effusions. No acute bony  abnormality. IMPRESSION: No active cardiopulmonary disease. Electronically Signed   By: Charlett Nose M.D.   On: 03/27/2017 09:28    Procedures Procedures (including critical care time)  Medications Ordered in ED Medications  aspirin chewable tablet 324 mg (324 mg Oral Given 03/27/17 0932)     Initial Impression / Assessment and Plan / ED Course  I have reviewed the triage vital signs and the nursing notes.  Pertinent labs & imaging results that were available during my care of the patient were reviewed by me and considered in my medical decision making (see chart for details).     Patient is to be discharged with recommendation to follow up with PCP in regards to today's hospital visit. Chest pain is not likely of cardiac or pulmonary etiology d/t presentation, perc negative, VSS, no tracheal deviation, no JVD or new murmur, RRR, breath sounds equal bilaterally, EKG without acute abnormalities, negative troponin, and negative CXR.Pain likely musculoskeletal.Return to the ED is CP becomes exertional, associated with diaphoresis or nausea, radiates to left jaw/arm, worsens or becomes concerning in any way. Pt appears reliable for follow up and is agreeable to discharge.     Final Clinical Impressions(s) / ED Diagnoses   Final diagnoses:  Chest wall pain    New Prescriptions New Prescriptions   No medications on file     Arthor Captain, PA-C 03/27/17 1213    Rolan Bucco, MD 03/27/17 1524

## 2017-03-27 NOTE — ED Notes (Signed)
[facility] ED Provider at bedside.

## 2017-03-27 NOTE — Discharge Instructions (Signed)
Follow-up next week if any problems. rest over the next couple days
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
## 2017-03-27 NOTE — Discharge Instructions (Signed)
Your work up was negative today. You have been diagnosed by your caregiver as having chest wall pain. SEEK IMMEDIATE MEDICAL ATTENTION IF: You develop a fever.  Your chest pains become severe or intolerable.  You develop new, unexplained symptoms (problems).  You develop shortness of breath, nausea, vomiting, sweating or feel light headed.  You develop a new cough or you cough up blood.

## 2017-10-03 ENCOUNTER — Emergency Department (HOSPITAL_BASED_OUTPATIENT_CLINIC_OR_DEPARTMENT_OTHER)
Admission: EM | Admit: 2017-10-03 | Discharge: 2017-10-03 | Disposition: A | Discharge status: Home / Self Care | Payer: Self-pay | Attending: Emergency Medicine | Admitting: Emergency Medicine

## 2017-10-03 ENCOUNTER — Encounter (HOSPITAL_BASED_OUTPATIENT_CLINIC_OR_DEPARTMENT_OTHER): Payer: Self-pay | Admitting: Emergency Medicine

## 2017-10-03 ENCOUNTER — Other Ambulatory Visit: Payer: Self-pay

## 2017-10-03 DIAGNOSIS — Z79899 Other long term (current) drug therapy: Secondary | ICD-10-CM | POA: Insufficient documentation

## 2017-10-03 DIAGNOSIS — M722 Plantar fascial fibromatosis: Secondary | ICD-10-CM | POA: Insufficient documentation

## 2017-10-03 DIAGNOSIS — I10 Essential (primary) hypertension: Secondary | ICD-10-CM | POA: Insufficient documentation

## 2017-10-03 DIAGNOSIS — Z87891 Personal history of nicotine dependence: Secondary | ICD-10-CM | POA: Insufficient documentation

## 2017-10-03 NOTE — Discharge Instructions (Addendum)
You may use over-the-counter Motrin (Ibuprofen), Acetaminophen (Tylenol), topical muscle creams such as Arnica, SalonPas, Icy Hot, Bengay, etc. Please stretch, apply heat or cold, and have massage or roll your foot over a firm ball for additional assistance.

## 2017-10-03 NOTE — ED Provider Notes (Signed)
[facility] EMERGENCY DEPARTMENT Provider Note  CSN: [id number] Arrival date & time: 10/03/17 4098  Chief Complaint(s) Foot Pain  HPI Jamie Christensen is a 32 y.o. female   The history is provided by the patient.  Foot Pain  This is a new problem. Episode onset: 3-4 days. The problem occurs constantly. The problem has not changed since onset.Pertinent negatives include no chest pain, no abdominal pain, no headaches and no shortness of breath. The symptoms are aggravated by walking. The symptoms are relieved by rest. Treatments tried: NSAIDs. The treatment provided mild relief.   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  She denies any trauma.  No fevers or chills.  Past Medical History Past Medical History:  Diagnosis Date  . Hypertension    There are no active problems to display for this patient.  Home Medication(s) Prior to Admission medications   Medication Sig Start Date End Date Taking? Authorizing Provider  lisinopril (PRINIVIL,ZESTRIL) 20 MG tablet Take 5 mg by mouth daily.    [provider]  meloxicam (MOBIC) 15 MG tablet Take 1 tablet (15 mg total) by mouth daily. Take 1 daily with food. Patient not taking: Reported on 03/27/2017 03/27/17   Arthor Captain, PA-C  ondansetron (ZOFRAN ODT) 4 MG disintegrating tablet 4mg  ODT q4 hours prn nausea/vomit 03/27/17   Bethann Berkshire, MD  traMADol (ULTRAM) 50 MG tablet Take 1 tablet (50 mg total) by mouth every 6 (six) hours as needed. 03/27/17   Bethann Berkshire, MD                                                                                                                                    Past Surgical History Past Surgical History:  Procedure Laterality Date  . HERNIA REPAIR    . TUBAL LIGATION     Family History Family History  Problem Relation Age of Onset  . Hyperlipidemia Mother     Social History Social History   Tobacco Use  . Smoking status: Former Smoker    Last attempt to  quit: 06/16/2008    Years since quitting: 9.3  . Smokeless tobacco: Never Used  Substance Use Topics  . Alcohol use: Yes    Comment: occassionally  . Drug use: No   Allergies Patient has no known allergies.  Review of Systems Review of Systems  Respiratory: Negative for shortness of breath.   Cardiovascular: Negative for chest pain.  Gastrointestinal: Negative for abdominal pain.  Neurological: Negative for headaches.   All other systems are reviewed and are negative for acute change except as noted in the HPI  Physical Exam Vital Signs  I have reviewed the triage vital signs BP 140/90 (BP Location: Left Arm)   Pulse 76   Temp 98.7 F (37.1 C) (Oral)   Resp 18   Ht 5\' 5"  (1.651 m)   Wt 86.2 kg (190 lb)  LMP 09/26/2017   SpO2 100%   BMI 31.62 kg/m   Physical Exam  Constitutional: She is oriented to person, place, and time. She appears well-developed and well-nourished. No distress.  HENT:  Head: Normocephalic and atraumatic.  Right Ear: External ear normal.  Left Ear: External ear normal.  Nose: Nose normal.  Eyes: Conjunctivae and EOM are normal. No scleral icterus.  Neck: Normal range of motion and phonation normal.  Cardiovascular: Normal rate and regular rhythm.  Pulmonary/Chest: Effort normal. No stridor. No respiratory distress.  Abdominal: She exhibits no distension.  Musculoskeletal: Normal range of motion. She exhibits no edema.       Right foot: There is tenderness. There is normal range of motion, no bony tenderness, no swelling and no deformity.       Left foot: There is tenderness. There is normal range of motion, no bony tenderness, no swelling and no deformity.  Flat footed  Neurological: She is alert and oriented to person, place, and time.  Skin: She is not diaphoretic.  Psychiatric: She has a normal mood and affect. Her behavior is normal.  Vitals reviewed.   ED Results and Treatments Labs (all labs ordered are listed, but only abnormal  results are displayed) Labs Reviewed - No data to display                                                                                                                       EKG  EKG Interpretation  Date/Time:    Ventricular Rate:    PR Interval:    QRS Duration:   QT Interval:    QTC Calculation:   R Axis:     Text Interpretation:        Radiology No results found. Pertinent labs & imaging results that were available during my care of the patient were reviewed by me and considered in my medical decision making (see chart for details).  Medications Ordered in ED Medications - No data to display                                                                                                                                  Procedures Procedures  (including critical care time)  Medical Decision Making / ED Course I have reviewed the nursing notes for this encounter and the patient's prior records (if available in EHR or on provided paperwork).    Presentation is most suspicious  for plantar fasciitis.  Patient denied any trauma concerning for fracture dislocation.  No fevers or chills concerning for infectious process.  Low suspicion for DVT.  Recommended symptomatic management and over-the-counter medications along with appropriate soled shoes for comfort.  Final Clinical Impression(s) / ED Diagnoses Final diagnoses:  Plantar fasciitis, bilateral    Disposition: Discharge  Condition: Good  I have discussed the results, Dx and Tx plan with the patient who expressed understanding and agree(s) with the plan. Discharge instructions discussed at great length. The patient was given strict return precautions who verbalized understanding of the instructions. No further questions at time of discharge.    ED Discharge Orders    None       Follow Up: Primary care provider   in 2-4 weeks, If symptoms do not improve or  worsen     This chart was dictated using voice  recognition software.  Despite best efforts to proofread,  errors can occur which can change the documentation meaning.   Nira Connardama, Shanika Levings Eduardo, MD 10/03/17 1004

## 2017-10-03 NOTE — ED Triage Notes (Signed)
Patient states that she has had pain to her bilateral feet x 2 -3 days  - she reports that she has been standing on her feet longer than usual. The patient reports that pain shoots up her feet into her legs

## 2018-11-03 ENCOUNTER — Encounter (HOSPITAL_BASED_OUTPATIENT_CLINIC_OR_DEPARTMENT_OTHER): Payer: Self-pay

## 2018-11-03 ENCOUNTER — Other Ambulatory Visit: Payer: Self-pay

## 2018-11-03 ENCOUNTER — Emergency Department (HOSPITAL_BASED_OUTPATIENT_CLINIC_OR_DEPARTMENT_OTHER)
Admission: EM | Admit: 2018-11-03 | Discharge: 2018-11-03 | Disposition: A | Discharge status: Home / Self Care | Payer: Medicaid Other | Attending: Emergency Medicine | Admitting: Emergency Medicine

## 2018-11-03 DIAGNOSIS — I10 Essential (primary) hypertension: Secondary | ICD-10-CM | POA: Insufficient documentation

## 2018-11-03 DIAGNOSIS — J029 Acute pharyngitis, unspecified: Secondary | ICD-10-CM | POA: Diagnosis present

## 2018-11-03 DIAGNOSIS — Z87891 Personal history of nicotine dependence: Secondary | ICD-10-CM | POA: Insufficient documentation

## 2018-11-03 DIAGNOSIS — Z79899 Other long term (current) drug therapy: Secondary | ICD-10-CM | POA: Insufficient documentation

## 2018-11-03 LAB — GROUP A STREP BY PCR: Group A Strep by PCR: NOT DETECTED

## 2018-11-03 MED ORDER — LIDOCAINE VISCOUS HCL 2 % MT SOLN: 15.0000 mL | Freq: Once | OROMUCOSAL | Status: DC

## 2018-11-03 MED ORDER — LIDOCAINE VISCOUS HCL 2 % MT SOLN: 15.0000 mL | OROMUCOSAL | 0 refills | Status: DC | PRN

## 2018-11-03 NOTE — Discharge Instructions (Addendum)
You have been diagnosed today with viral sore throat.  At this time there does not appear to be the presence of an emergent medical condition, however there is always the potential for conditions to change. Please read and follow the below instructions.  Please return to the Emergency Department immediately for any new or worsening symptoms or if your symptoms do not improve within 3 days. Please be sure to follow up with your Primary Care Provider within one week regarding your visit today; please call their office to schedule an appointment even if you are feeling better for a follow-up visit. You may use the viscous lidocaine prescribed to you today to help with your sore throat.  Do not swallow this medication, rinse and spit.  You may also use over-the-counter Tylenol as directed on the packaging to help with your sore throat.  Over-the-counter throat lozenges may be helpful as well.  Please drink plenty water to avoid dehydration and return for any new or worsening symptoms. As we discussed based on current guidelines testing for COVID-19 is not indicated here in the emergency department at this time.  It is possible that your sore throat may be caused by the COVID-19 virus so we advised that you self quarantine for 2 weeks to avoid potential spread.  Follow-up with your primary care provider via telephone visit within 2-3 days for reevaluation.  Your primary care provider may also set you up with outpatient/drive-through [id number] testing if indicated and available in the future.  Return to emerge department immediately for any new or worsening symptoms.  Get help right away if: You feel pain or pressure in your chest. You have shortness of breath. You faint or feel like you will faint. You keep throwing up (vomiting). You feel confused. Get help right away if: You have trouble breathing. You cannot swallow fluids, soft foods, or your saliva. You have swelling in your throat or neck that gets  worse. You keep feeling sick to your stomach (nauseous). You keep throwing up (vomiting). Please read the additional information packets attached to your discharge summary.  Do not take your medicine if  develop an itchy rash, swelling in your mouth or lips, or difficulty breathing.  Seek emergency medical attention/call 911 if this occurs.

## 2018-11-03 NOTE — ED Triage Notes (Signed)
C/o sore throat x 2 days-NAD-steady gait 

## 2018-11-03 NOTE — ED Notes (Addendum)
Assumed pt care at discharge, pt verbalizes understanding of dc instructions and denies any further needs at this time

## 2018-11-03 NOTE — ED Provider Notes (Signed)
[facility] EMERGENCY DEPARTMENT Provider Note   CSN: [id number] Arrival date & time: 11/03/18  1634    History   Chief Complaint Chief Complaint  Patient presents with  . Sore Throat    HPI Jamie Christensen is a 33 y.o. female with history of hypertension, tubal ligation and hernia repair presenting today for sore throat.  Patient reports that 2 days ago she developed a sore throat that she describes as a scratchy sensation on both sides of her throat worse on left than has been constant since onset moderate in intensity worsened with swallowing and slightly relieved with NyQuil.  Additionally patient reports nasal congestion x2 days.  Finally patient states that she has had a mild intermittent nonproductive cough that she feels is caused by her scratchy throat.  Patient denies any fevers/chills, chest pain/shortness of breath, headache, neck pain/swelling, hemoptysis, sputum production, abdominal pain, nausea/vomiting, diarrhea, extremity pain/swelling or any additional concerns.  Patient reports that she has been eating and drinking normally today, had lunch without difficulty swallowing.     HPI  Past Medical History:  Diagnosis Date  . Hypertension     There are no active problems to display for this patient.   Past Surgical History:  Procedure Laterality Date  . HERNIA REPAIR    . TUBAL LIGATION       OB History    Gravida  0   Para  0   Term      Preterm      AB      Living        SAB      TAB      Ectopic      Multiple      Live Births               Home Medications    Prior to Admission medications   Medication Sig Start Date End Date Taking? Authorizing Provider  lidocaine (XYLOCAINE) 2 % solution Use as directed 15 mLs in the mouth or throat as needed for mouth pain (Do NOT swallow). 11/03/18   Harlene Salts A, PA-C  lisinopril (PRINIVIL,ZESTRIL) 20 MG tablet Take 5 mg by mouth daily.    [provider]  meloxicam  (MOBIC) 15 MG tablet Take 1 tablet (15 mg total) by mouth daily. Take 1 daily with food. Patient not taking: Reported on 03/27/2017 03/27/17   Arthor Captain, PA-C  ondansetron (ZOFRAN ODT) 4 MG disintegrating tablet  ODT q4 hours prn nausea/vomit 03/27/17   Bethann Berkshire, MD  traMADol (ULTRAM) 50 MG tablet Take 1 tablet (50 mg total) by mouth every 6 (six) hours as needed. 03/27/17   Bethann Berkshire, MD    Family History Family History  Problem Relation Age of Onset  . Hyperlipidemia Mother     Social History Social History   Tobacco Use  . Smoking status: Former Smoker    Last attempt to quit: 06/16/2008    Years since quitting: 10.3  . Smokeless tobacco: Never Used  Substance Use Topics  . Alcohol use: Yes    Comment: occassionally  . Drug use: No     Allergies   Patient has no known allergies.   Review of Systems Review of Systems  Constitutional: Negative.  Negative for chills and fever.  HENT: Positive for congestion and sore throat. Negative for dental problem, ear pain, facial swelling, sinus pressure, sinus pain, trouble swallowing and voice change.   Eyes: Negative.  Negative for visual disturbance.  Respiratory: Positive for cough (mild non productive, attributes to sore throat). Negative for choking, chest tightness, shortness of breath and wheezing.   Cardiovascular: Negative.  Negative for chest pain, palpitations and leg swelling.  Gastrointestinal: Negative.  Negative for abdominal pain, diarrhea, nausea and vomiting.  Musculoskeletal: Negative.  Negative for neck pain.  Neurological: Negative.  Negative for headaches.   Physical Exam Updated Vital Signs BP (!) 142/101 (BP Location: Left Arm)   Pulse 80   Temp 98.4 F (36.9 C) (Oral)   Resp 16   Ht 5\' 5"  (1.651 m)   Wt 94.3 kg   LMP 10/22/2018   SpO2 99%   BMI 34.61 kg/m   Physical Exam Constitutional:      General: She is not in acute distress.    Appearance: Normal appearance. She is  well-developed. She is not ill-appearing or diaphoretic.  HENT:     Head: Normocephalic and atraumatic.     Jaw: There is normal jaw occlusion. No trismus.     Right Ear: Tympanic membrane, ear canal and external ear normal.     Left Ear: Tympanic membrane, ear canal and external ear normal.     Nose: Rhinorrhea present. Rhinorrhea is clear.     Right Nostril: No epistaxis.     Left Nostril: No epistaxis.     Right Sinus: No maxillary sinus tenderness or frontal sinus tenderness.     Left Sinus: No maxillary sinus tenderness or frontal sinus tenderness.     Mouth/Throat:     Lips: Pink.     Mouth: Mucous membranes are moist.     Pharynx: Oropharynx is clear. Uvula midline.     Comments: Patient with posterior oropharynx cobblestoning consistent with postnasal drip.  The patient has normal phonation and is in control of secretions. No stridor.  Midline uvula without edema. Soft palate rises symmetrically. No tonsillar erythema, swelling or exudates. Tongue protrusion is normal, floor of mouth is soft. No trismus. No creptius on neck palpation. No gingival erythema or fluctuance noted. Mucus membranes moist. No pallor noted. Eyes:     General: Vision grossly intact. Gaze aligned appropriately.     Extraocular Movements: Extraocular movements intact.     Conjunctiva/sclera: Conjunctivae normal.     Pupils: Pupils are equal, round, and reactive to light.     Comments: No pain with extraocular motion  Neck:     Musculoskeletal: Full passive range of motion without pain, normal range of motion and neck supple.     Trachea: Trachea and phonation normal. No tracheal tenderness or tracheal deviation.     Meningeal: Brudzinski's sign absent.  Cardiovascular:     Rate and Rhythm: Normal rate and regular rhythm.     Heart sounds: Normal heart sounds.  Pulmonary:     Effort: Pulmonary effort is normal. No accessory muscle usage or respiratory distress.     Breath sounds: Normal breath sounds and  air entry. No stridor. No wheezing or rhonchi.  Abdominal:     General: There is no distension.     Palpations: Abdomen is soft.     Tenderness: There is no abdominal tenderness. There is no guarding or rebound.  Musculoskeletal: Normal range of motion.  Lymphadenopathy:     Cervical: No cervical adenopathy.  Skin:    General: Skin is warm and dry.  Neurological:     Mental Status: She is alert.     GCS: GCS eye subscore is 4. GCS verbal subscore is 5. GCS motor subscore  is 6.     Comments: Speech is clear and goal oriented, follows commands Major Cranial nerves without deficit, no facial droop Moves extremities without ataxia, coordination intact Normal gait  Psychiatric:        Behavior: Behavior normal.    ED Treatments / Results  Labs (all labs ordered are listed, but only abnormal results are displayed) Labs Reviewed  GROUP A STREP BY PCR    EKG None  Radiology No results found.  Procedures Procedures (including critical care time)  Medications Ordered in ED Medications  lidocaine (XYLOCAINE) 2 % viscous mouth solution 15 mL (has no administration in time range)     Initial Impression / Assessment and Plan / ED Course  I have reviewed the triage vital signs and the nursing notes.  Pertinent labs & imaging results that were available during my care of the patient were reviewed by me and considered in my medical decision making (see chart for details).    Jamie Christensen is a 33 y.o. female who presents today with sore throat and nasal congestion x2 days.  Airway is patient, patient reports normal phonation and clinically patient does not sound muffled. Tonsils appear symmetric without exudate and their is no uvula deviation.  No tonsillar erythema on examination.  Patient does have posterior oropharynx cobblestoning consistent with postnasal drip.  No trismus on examination. Floor of the mouth is soft/nontender and their is no abnormal tongue protrusion. Patient is  tolerating their own secretions without drooling and patient is able to drink here in the emergency department. Examination of the neck reveals no cervical lymphadenopathy. Abdominal exam reveals a soft/nontender abdomen without palpable LUQ mass or tenderness.  Strep test negative  Suspect that patient's sore throat due to viral pharyngitis. Do not suspect bacterial infection as etiology of symptoms today. No signs of Peritonsillar Abscess, Ludwig's Angina, Retropharyngeal Abscess, Dental Abscess or other deep tissue infections of the head or neck. Additionally do not suspect mononucleosis, gonococcal pharyngitis, foreign body or epiglottitis at this time.  Viscous lidocaine rinse prescribed for comfort. No antibiotics indicated at this time.  Patient does not appear dehydrated, but did discuss importance of water rehydration. Patient counseled on conservative home remedies for sore throat.  As to patient's mild cough without sputum production, hemoptysis, shortness of breath or chest pain.  She feels this is related to a "tickle" in her throat.  Her lungs are clear to auscultation bilaterally.  Her symptoms x2 days without fever, I do not suspect pneumonia as cause of her cough.  Additionally low risk by Wells and PERC negative without chest pain or shortness of breath do not suspect PE as cause of cough.  Suspect cough secondary to her postnasal drip.  I offered the patient chest x-ray and she refused, shared decision making made, this is a reasonable decision based on history and examination today.  Jamie Christensen was evaluated in Emergency Department on 11/03/2018 for the symptoms described in the history of present illness. She was evaluated in the context of the global COVID-19 pandemic, which necessitated consideration that the patient might be at risk for infection with the SARS-CoV-2 virus that causes COVID-19. Institutional protocols and algorithms that pertain to the evaluation of patients at  risk for COVID-19 are in a state of rapid change based on information released by regulatory bodies including the CDC and federal and state organizations. These policies and algorithms were followed during the patient's care in the ED. Per current guidelines COVID-19 testing not indicated at  this time.  I have informed patient to self quarantine and follow-up with PCP via telehealth.  Of note patient was hypertensive here in the emergency department she is asymptomatic regarding this without headache, chest pain, shortness of breath, visual changes, dizziness, decreased urination or any additional concerns.  I informed patient that she should continue taking her high blood pressure medication as previously prescribed and follow-up with her primary care provider within 1 week blood pressure recheck and medication management.  I discussed the signs and symptoms of hypertensive urgency/emergency and that she should return to emergency department immediately if these occur.  Patient stated understanding.  At this time there does not appear to be any evidence of an acute emergency medical condition and the patient appears stable for discharge with appropriate outpatient follow up. Diagnosis was discussed with patient who verbalizes understanding of care plan and is agreeable to discharge. I have discussed return precautions with patient who verbalizes understanding of return precautions. Patient encouraged to follow-up with their PCP. All questions answered.  Patient has been discharged in good condition.   Note: Portions of this report may have been transcribed using voice recognition software. Every effort was made to ensure accuracy; however, inadvertent computerized transcription errors may still be present. Final Clinical Impressions(s) / ED Diagnoses   Final diagnoses:  Viral pharyngitis    ED Discharge Orders         Ordered    lidocaine (XYLOCAINE) 2 % solution  As needed     11/03/18 1812            Elizabeth Palau 11/03/18 1835    Sabas Sous, MD 11/03/18 2012

## 2020-09-30 ENCOUNTER — Emergency Department (HOSPITAL_BASED_OUTPATIENT_CLINIC_OR_DEPARTMENT_OTHER): Payer: BLUE CROSS/BLUE SHIELD

## 2020-09-30 ENCOUNTER — Other Ambulatory Visit: Payer: Self-pay

## 2020-09-30 ENCOUNTER — Encounter (HOSPITAL_BASED_OUTPATIENT_CLINIC_OR_DEPARTMENT_OTHER): Payer: Self-pay | Admitting: Emergency Medicine

## 2020-09-30 ENCOUNTER — Emergency Department (HOSPITAL_BASED_OUTPATIENT_CLINIC_OR_DEPARTMENT_OTHER)
Admission: EM | Admit: 2020-09-30 | Discharge: 2020-09-30 | Disposition: A | Discharge status: Home / Self Care | Payer: BLUE CROSS/BLUE SHIELD | Attending: Emergency Medicine | Admitting: Emergency Medicine

## 2020-09-30 DIAGNOSIS — Z87891 Personal history of nicotine dependence: Secondary | ICD-10-CM | POA: Diagnosis not present

## 2020-09-30 DIAGNOSIS — K6289 Other specified diseases of anus and rectum: Secondary | ICD-10-CM | POA: Diagnosis present

## 2020-09-30 LAB — BASIC METABOLIC PANEL
Anion gap: 9 (ref 5–15)
BUN: 11 mg/dL (ref 6–20)
CO2: 23 mmol/L (ref 22–32)
Calcium: 9.3 mg/dL (ref 8.9–10.3)
Chloride: 104 mmol/L (ref 98–111)
Creatinine, Ser: 0.79 mg/dL (ref 0.44–1.00)
GFR, Estimated: >60 mL/min (ref >60)
Glucose, Bld: 118 mg/dL — ABNORMAL HIGH (ref 70–99)
Potassium: 3.4 mmol/L — ABNORMAL LOW (ref 3.5–5.1)
Sodium: 136 mmol/L (ref 135–145)

## 2020-09-30 LAB — CBC WITH DIFFERENTIAL/PLATELET
Abs Immature Granulocytes: 0.01 10*3/uL (ref 0.00–0.07)
Basophils Absolute: 0 10*3/uL (ref 0.0–0.1)
Basophils Relative: 0 %
Eosinophils Absolute: 0.1 10*3/uL (ref 0.0–0.5)
Eosinophils Relative: 1 %
HCT: 38.4 % (ref 36.0–46.0)
Hemoglobin: 13.1 g/dL (ref 12.0–15.0)
Immature Granulocytes: 0 %
Lymphocytes Relative: 28 %
Lymphs Abs: 2.5 10*3/uL (ref 0.7–4.0)
MCH: 28.9 pg (ref 26.0–34.0)
MCHC: 34.1 g/dL (ref 30.0–36.0)
MCV: 84.8 fL (ref 80.0–100.0)
Monocytes Absolute: 0.6 10*3/uL (ref 0.1–1.0)
Monocytes Relative: 7 %
Neutro Abs: 5.5 10*3/uL (ref 1.7–7.7)
Neutrophils Relative %: 64 %
Platelets: 292 10*3/uL (ref 150–400)
RBC: 4.53 MIL/uL (ref 3.87–5.11)
RDW: 12.8 % (ref 11.5–15.5)
WBC: 8.6 10*3/uL (ref 4.0–10.5)
nRBC: 0 % (ref 0.0–0.2)

## 2020-09-30 LAB — PREGNANCY, URINE: Preg Test, Ur: NEGATIVE

## 2020-09-30 MED ORDER — LIDOCAINE VISCOUS HCL 2 % MT SOLN: 5.0000 mL | OROMUCOSAL | 0 refills | Status: DC | PRN

## 2020-09-30 MED ORDER — ONDANSETRON HCL 4 MG/2ML IJ SOLN
4.0000 mg | Freq: Once | INTRAMUSCULAR | Status: AC
  Administered 2020-09-30: 4 mg via INTRAVENOUS
  Filled 2020-09-30: qty 2

## 2020-09-30 MED ORDER — IOHEXOL 300 MG/ML  SOLN
100.0000 mL | Freq: Once | INTRAMUSCULAR | Status: AC | PRN
  Administered 2020-09-30: 100 mL via INTRAVENOUS

## 2020-09-30 MED ORDER — MORPHINE SULFATE (PF) 4 MG/ML IV SOLN
4.0000 mg | Freq: Once | INTRAVENOUS | Status: AC
  Administered 2020-09-30: 4 mg via INTRAVENOUS
  Filled 2020-09-30: qty 1

## 2020-09-30 MED ORDER — OXYCODONE-ACETAMINOPHEN 5-325 MG PO TABS: 1.0000 | ORAL_TABLET | Freq: Once | ORAL | Status: DC

## 2020-09-30 NOTE — ED Notes (Signed)
Pt came back, IV was not present on assessment; pt given discharge papers; pt stable, see EDP assessment

## 2020-09-30 NOTE — ED Notes (Signed)
This RN went to discharge pt, found another RN cleaning room who said there was no one in this room; no other RN discharged this pt and no IV was noted in room; this RN called, pt said she took out IV, she "thought I was done"; pt aware of need to confirm IV removal, says she will come back.

## 2020-09-30 NOTE — Discharge Instructions (Addendum)
You were seen in the emerge department today with pain in your rectal area.  Your lab work and CT scans look normal.  I have called in a prescription for some lidocaine gel which you can apply to gauze and place in your rectal area.  This will allow numbing to happen where the gauze is touching.  Please continue your medications and call your surgery team to make them aware you were in the emergency department with this issue.  Return to the emergency department any new or suddenly worsening symptoms.

## 2020-09-30 NOTE — ED Provider Notes (Signed)
Emergency Department Provider Note   I have reviewed the triage vital signs and the nursing notes.   HISTORY  Chief Complaint Rectal Bleeding   HPI Jamie Christensen is a 35 y.o. female with past medical history reviewed below including recent hemorrhoidectomy at [facility] presents to the ED with severe rectal pain and bleeding.  In review of the operative note it appears that the patient had some external hemorrhoids and external hemorrhoid residual tissue which was excised.  No mention of internal hemorrhoid removal. Procedure performed with Dr. Buzzy Han. Patient states that this morning she had severe rectal pain with a bowel movement and passed a moderate amount of blood.  She has continued to have some bright red blood with spotting type amount.  No fevers or chills.  No vomiting.  No abdominal or back pain.  No UTI symptoms. Pain is constant and severe. Has been taking Miralax at home.    History reviewed. No pertinent past medical history.  There are no problems to display for this patient.   Past Surgical History:  Procedure Laterality Date  . HEMORROIDECTOMY    . HERNIA REPAIR    . TUBAL LIGATION      Allergies Patient has no known allergies.  Family History  Problem Relation Age of Onset  . Hyperlipidemia Mother     Social History Social History   Tobacco Use  . Smoking status: Former Smoker    Quit date: 06/16/2008    Years since quitting: 12.3  . Smokeless tobacco: Never Used  Vaping Use  . Vaping Use: Never used  Substance Use Topics  . Alcohol use: Yes    Comment: occassionally  . Drug use: No    Review of Systems  Constitutional: No fever/chills Eyes: No visual changes. ENT: No sore throat. Cardiovascular: Denies chest pain. Respiratory: Denies shortness of breath. Gastrointestinal: No abdominal pain.  No nausea, no vomiting.  No diarrhea.  No constipation. Positive rectal pain and bleeding.  Genitourinary: Negative for  dysuria. Musculoskeletal: Negative for back pain. Skin: Negative for rash. Neurological: Negative for headaches, focal weakness or numbness.  10-point ROS otherwise negative.  ____________________________________________   PHYSICAL EXAM:  VITAL SIGNS: ED Triage Vitals  Enc Vitals Group     BP 09/30/20 1404 (!) 150/103     Pulse Rate 09/30/20 1404 (!) 107     Resp 09/30/20 1404 18     Temp 09/30/20 1404 98.8 F (37.1 C)     Temp Source 09/30/20 1404 Oral     SpO2 09/30/20 1404 100 %   Constitutional: Alert and oriented. Well appearing and in no acute distress. Eyes: Conjunctivae are normal.  Head: Atraumatic. Nose: No congestion/rhinnorhea. Mouth/Throat: Mucous membranes are moist.   Neck: No stridor. Cardiovascular: Normal rate, regular rhythm. Good peripheral circulation. Grossly normal heart sounds.   Respiratory: Normal respiratory effort.  No retractions. Lungs CTAB. Gastrointestinal: Soft and nontender. No distention.  Rectal exam performed with nurse chaperone and after patient's verbal consent.  I do not see any active bleeding or fissure.  She has exquisite tenderness with touching around the anus.  No cellulitis or abscess visible.  Musculoskeletal: No gross deformities of extremities. Neurologic:  Normal speech and language. Skin:  Skin is warm, dry and intact. No rash noted.  ____________________________________________   LABS (all labs ordered are listed, but only abnormal results are displayed)  Labs Reviewed  BASIC METABOLIC PANEL - Abnormal; Notable for the following components:      Result  Value   Potassium 3.4 (*)    Glucose, Bld 118 (*)    All other components within normal limits  CBC WITH DIFFERENTIAL/PLATELET  PREGNANCY, URINE   ____________________________________________  RADIOLOGY  CT pelvis with contrast reviewed.  ____________________________________________   PROCEDURES  Procedure(s) performed:    Procedures  None ____________________________________________   INITIAL IMPRESSION / ASSESSMENT AND PLAN / ED COURSE  Pertinent labs & imaging results that were available during my care of the patient were reviewed by me and considered in my medical decision making (see chart for details).   Patient presents to the emergency department with significant rectal pain and bleeding this morning after external hemorrhoidectomy on 4/13 w/ Dr. Buzzy Han at [facility].  No bleeding or fissures on exam.  With 4 days postop will perform CT to evaluate for localized area of infection or abscess. CBC reassuring. Plan for morphine for pain control in the ED but will need to limit opiate medications as this can be constipating.   Labs and CT pelvis reviewed. No post operative complication, perirectal abscess, or other cause for patient's pain. Plan for close surgery follow up and lidocaine gel Rx from the ED. Patient was prescribed Vicodin by the surgery team. Will not change/increase this in the ED with constipation likely to make pain/complications even worse. Pain well controlled here after IV meds in the ED. Discussed ED return precautions.  ____________________________________________  FINAL CLINICAL IMPRESSION(S) / ED DIAGNOSES  Final diagnoses:  Rectal pain     MEDICATIONS GIVEN DURING THIS VISIT:  Medications  morphine 4 MG/ML injection 4 mg (4 mg Intravenous Given 09/30/20 1622)  ondansetron (ZOFRAN) injection 4 mg (4 mg Intravenous Given 09/30/20 1620)  iohexol (OMNIPAQUE) 300 MG/ML solution 100 mL (100 mLs Intravenous Contrast Given 09/30/20 1737)     Note:  This document was prepared using Dragon voice recognition software and may include unintentional dictation errors.  Alona Bene, MD, [facility] Emergency Medicine    Bethel Sirois, Arlyss Repress, MD 10/01/20 1754

## 2020-09-30 NOTE — ED Triage Notes (Signed)
Emergency Medicine Provider Triage Evaluation Note  Jamie Christensen , a 35 y.o. female  was evaluated in triage.  Pt complains of rectal bleeding and pain.  Patient reports her symptoms began after having a bowel movement today.  Patient denies straining or constipation during this bowel movement.  Endorses large amount of bright red blood after having her bowel movement.  Patient endorses "spotting," of rectal bleeding since then.  Patient endorses 10/10 pain to her rectum at this time.  Patient reports feeling lightheaded during her bowel movement but denies any syncopal episode.  Patient reports that she had a hemorrhoidectomy 4 days prior.  Review of Systems  Positive: Rectal bleeding, rectal pain Negative: Single episode  Physical Exam  BP (!) 150/103 (BP Location: Left Arm)   Pulse (!) 107   Temp 98.8 F (37.1 C) (Oral)   Resp 18   LMP 09/23/2020   SpO2 100%  Gen:   Awake, no distress   HEENT:  Atraumatic  Resp:  Normal effort  Cardiac:  Tachycardic at rate of 107 MSK:   Moves extremities without difficulty  Neuro:  Speech clear   Medical Decision Making  Medically screening exam initiated at 2:50 PM.  Appropriate orders placed.  Ellisa Devivo was informed that the remainder of the evaluation will be completed by another provider, this initial triage assessment does not replace that evaluation, and the importance of remaining in the ED until their evaluation is complete.  Clinical Impression   The patient appears stable so that the remainder of the work up may be completed by another provider.      Haskel Schroeder, [place] 09/30/20 1452

## 2020-09-30 NOTE — ED Triage Notes (Signed)
Hemorrhoidectomy 4 days ago , rectal bleeding this morning , severe pain per pt . Had a bm today before the bleeding.

## 2021-12-02 ENCOUNTER — Emergency Department (HOSPITAL_BASED_OUTPATIENT_CLINIC_OR_DEPARTMENT_OTHER): Payer: BLUE CROSS/BLUE SHIELD

## 2021-12-02 ENCOUNTER — Encounter (HOSPITAL_BASED_OUTPATIENT_CLINIC_OR_DEPARTMENT_OTHER): Payer: Self-pay | Admitting: Emergency Medicine

## 2021-12-02 ENCOUNTER — Emergency Department (HOSPITAL_BASED_OUTPATIENT_CLINIC_OR_DEPARTMENT_OTHER)
Admission: EM | Admit: 2021-12-02 | Discharge: 2021-12-02 | Disposition: A | Discharge status: Home / Self Care | Payer: BLUE CROSS/BLUE SHIELD | Attending: Emergency Medicine | Admitting: Emergency Medicine

## 2021-12-02 ENCOUNTER — Other Ambulatory Visit: Payer: Self-pay

## 2021-12-02 DIAGNOSIS — M25561 Pain in right knee: Secondary | ICD-10-CM | POA: Diagnosis not present

## 2021-12-02 DIAGNOSIS — M25461 Effusion, right knee: Secondary | ICD-10-CM | POA: Diagnosis not present

## 2021-12-02 DIAGNOSIS — M25562 Pain in left knee: Secondary | ICD-10-CM | POA: Diagnosis present

## 2021-12-02 DIAGNOSIS — M25462 Effusion, left knee: Secondary | ICD-10-CM | POA: Insufficient documentation

## 2021-12-02 MED ORDER — NAPROXEN 500 MG PO TABS: 500.0000 mg | ORAL_TABLET | Freq: Two times a day (BID) | ORAL | 0 refills | Status: DC | PRN

## 2021-12-02 MED ORDER — KETOROLAC TROMETHAMINE 30 MG/ML IJ SOLN
30.0000 mg | Freq: Once | INTRAMUSCULAR | Status: AC
  Administered 2021-12-02: 30 mg via INTRAMUSCULAR
  Filled 2021-12-02: qty 1

## 2021-12-02 NOTE — ED Triage Notes (Signed)
Pt arrives pov, steady gait, c/o bilateral knee pain and swelling x 1 week. Denies injury. Mild relief with otc meds

## 2021-12-02 NOTE — ED Notes (Signed)
Patient transported to X-ray 

## 2021-12-02 NOTE — Discharge Instructions (Signed)
Please follow-up with your primary doctor or with an orthopedic or sports medicine doctor regarding your knee pain.  Come back to ER for difficulty walking, swelling, fever, redness or other new concerning symptom or if the pain/swelling seems to spread to your calves or thighs.

## 2021-12-02 NOTE — ED Provider Notes (Signed)
[facility] EMERGENCY DEPARTMENT Provider Note   CSN: [id number] Arrival date & time: 12/02/21  0746     History  Chief Complaint  Patient presents with   Knee Pain    Jamie Christensen is a 36 y.o. female.  Presenting to the prescribed due to concern for knee pain.  Patient reports over the past couple weeks she has been having some intermittent pain in both of her knees.  Also has noted some swelling in both of her knees.  She denies any sort of pain or swelling in any other parts of her legs.  No chills, fevers, recent illnesses.  No major medical problems.  HPI     Home Medications Prior to Admission medications   Medication Sig Start Date End Date Taking? Authorizing Provider  naproxen (NAPROSYN) 500 MG tablet Take 1 tablet (500 mg total) by mouth 2 (two) times daily as needed. 12/02/21  Yes Vashawn Ekstein, Quitman Livings, MD  lidocaine (XYLOCAINE) 2 % solution Use as directed 5 mLs in the mouth or throat every 4 (four) hours as needed (rectal pain). 09/30/20   Long, Arlyss Repress, MD  lisinopril (PRINIVIL,ZESTRIL) 20 MG tablet Take 5 mg by mouth daily.    [provider]  meloxicam (MOBIC) 15 MG tablet Take 1 tablet (15 mg total) by mouth daily. Take 1 daily with food. Patient not taking: Reported on 03/27/2017 03/27/17   Arthor Captain, PA-C  ondansetron (ZOFRAN ODT) 4 MG disintegrating tablet 4mg  ODT q4 hours prn nausea/vomit 03/27/17   05/27/17, MD  traMADol (ULTRAM) 50 MG tablet Take 1 tablet (50 mg total) by mouth every 6 (six) hours as needed. 03/27/17   05/27/17, MD      Allergies    Patient has no known allergies.    Review of Systems   Review of Systems  Constitutional:  Negative for chills and fever.  HENT:  Negative for ear pain and sore throat.   Eyes:  Negative for pain and visual disturbance.  Respiratory:  Negative for cough and shortness of breath.   Cardiovascular:  Negative for chest pain and palpitations.  Gastrointestinal:  Negative for  abdominal pain and vomiting.  Genitourinary:  Negative for dysuria and hematuria.  Musculoskeletal:  Positive for arthralgias. Negative for back pain.  Skin:  Negative for color change and rash.  Neurological:  Negative for seizures and syncope.  All other systems reviewed and are negative.   Physical Exam Updated Vital Signs BP 134/84   Pulse 92   Temp 98.1 F (36.7 C) (Oral)   Resp 18   Ht 5\' 6"  (1.676 m)   Wt 102.1 kg   LMP 10/25/2021 Comment: Tubal LIgation  SpO2 99%   BMI 36.32 kg/m  Physical Exam Vitals and nursing note reviewed.  Constitutional:      General: She is not in acute distress.    Appearance: She is well-developed.  HENT:     Head: Normocephalic and atraumatic.  Eyes:     Conjunctiva/sclera: Conjunctivae normal.  Cardiovascular:     Rate and Rhythm: Normal rate and regular rhythm.     Heart sounds: No murmur heard. Pulmonary:     Effort: Pulmonary effort is normal. No respiratory distress.     Breath sounds: Normal breath sounds.  Abdominal:     Palpations: Abdomen is soft.     Tenderness: There is no abdominal tenderness.  Musculoskeletal:     Cervical back: Neck supple.     Comments:  RLE: Mild tenderness  to knee, no deformity, normal joint ROM, distal pulse, sensation and motor intact LLE: Mild tenderness to knee, no deformity, normal joint ROM, distal pulse, sensation and motor intact  Skin:    General: Skin is warm and dry.     Capillary Refill: Capillary refill takes less than 2 seconds.  Neurological:     General: No focal deficit present.     Mental Status: She is alert.  Psychiatric:        Mood and Affect: Mood normal.     ED Results / Procedures / Treatments   Labs (all labs ordered are listed, but only abnormal results are displayed) Labs Reviewed - No data to display  EKG None  Radiology DG Knee Complete 4 Views Left  Result Date: 12/02/2021 CLINICAL DATA:  Bilateral knee pain for a week.  No injury. EXAM: LEFT KNEE -  COMPLETE 4+ VIEW COMPARISON:  None Available. FINDINGS: No fracture or bone lesion. Mild narrowing of the medial joint space with small associated marginal osteophytes. No other degenerative/arthropathic change. No joint effusion.  Surrounding soft tissues are unremarkable. IMPRESSION: 1. No fracture or acute finding. 2. Mild medial compartment osteoarthritis. Electronically Signed   By: Amie Portland M.D.   On: 12/02/2021 08:47   DG Knee Complete 4 Views Right  Result Date: 12/02/2021 CLINICAL DATA:  Bilateral knee pain.  No known injury. EXAM: RIGHT KNEE - COMPLETE 4+ VIEW COMPARISON:  None Available. FINDINGS: There is no joint effusion identified. No acute fracture or dislocation identified. No radio-opaque foreign bodies or soft tissue calcifications. No significant arthropathy. IMPRESSION: Negative. Electronically Signed   By: Signa Kell M.D.   On: 12/02/2021 08:46    Procedures Procedures    Medications Ordered in ED Medications  ketorolac (TORADOL) 30 MG/ML injection 30 mg (30 mg Intramuscular Given 12/02/21 0867)    ED Course/ Medical Decision Making/ A&P                           Medical Decision Making Amount and/or Complexity of Data Reviewed Radiology: ordered.  Risk Prescription drug management.   36 year old lady presenting to ER for bilateral knee pain/swelling.  On physical exam she appears well in no distress, her knees do not appear appreciably visibly swollen.  She has normal ROM, no erythema, doubt infectious process.  No pain reported to calf or thigh regions, no swelling to calf or thigh noted, normal distal pulses.  Doubt DVT.  Check plain films, negative.  I independently reviewed and interpreted.  No fracture or dislocation.  Radiologist commented on some mild osteoarthritis.  I advised patient to follow-up with Ortho or sports medicine.  Reviewed return precautions and discharged.  Pain improved with Toradol.        Final Clinical Impression(s) / ED  Diagnoses Final diagnoses:  Acute pain of both knees    Rx / DC Orders ED Discharge Orders          Ordered    naproxen (NAPROSYN) 500 MG tablet  2 times daily PRN        12/02/21 0855              Milagros Loll, MD 12/02/21 [phone]

## 2021-12-02 NOTE — ED Notes (Signed)
Pt discharged to home. Discharge instructions have been discussed with patient and/or family members. Pt verbally acknowledges understanding d/c instructions, and endorses comprehension to checkout at registration before leaving.  °

## 2023-04-02 ENCOUNTER — Encounter (HOSPITAL_BASED_OUTPATIENT_CLINIC_OR_DEPARTMENT_OTHER): Payer: Self-pay

## 2023-04-02 ENCOUNTER — Other Ambulatory Visit: Payer: Self-pay

## 2023-04-02 DIAGNOSIS — M545 Low back pain, unspecified: Secondary | ICD-10-CM | POA: Insufficient documentation
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
## 2023-04-03 ENCOUNTER — Emergency Department (HOSPITAL_BASED_OUTPATIENT_CLINIC_OR_DEPARTMENT_OTHER)
Admission: EM | Admit: 2023-04-03 | Discharge: 2023-04-03 | Disposition: A | Discharge status: Home / Self Care | Payer: BLUE CROSS/BLUE SHIELD | Attending: Emergency Medicine | Admitting: Emergency Medicine

## 2023-04-03 DIAGNOSIS — M545 Low back pain, unspecified: Secondary | ICD-10-CM

## 2023-04-03 MED ORDER — HYDROMORPHONE HCL 1 MG/ML IJ SOLN
1.0000 mg | Freq: Once | INTRAMUSCULAR | Status: AC
  Administered 2023-04-03: 1 mg via INTRAMUSCULAR
  Filled 2023-04-03: qty 1

## 2023-04-03 MED ORDER — KETOROLAC TROMETHAMINE 60 MG/2ML IM SOLN
30.0000 mg | Freq: Once | INTRAMUSCULAR | Status: AC
  Administered 2023-04-03: 30 mg via INTRAMUSCULAR
  Filled 2023-04-03: qty 2

## 2023-04-03 MED ORDER — CYCLOBENZAPRINE HCL 10 MG PO TABS: 10.0000 mg | ORAL_TABLET | Freq: Two times a day (BID) | ORAL | 0 refills | Status: DC | PRN

## 2023-04-03 MED ORDER — CYCLOBENZAPRINE HCL 5 MG PO TABS
5.0000 mg | ORAL_TABLET | Freq: Once | ORAL | Status: AC
  Administered 2023-04-03: 5 mg via ORAL
  Filled 2023-04-03: qty 1

## 2023-04-03 MED ORDER — MELOXICAM 15 MG PO TABS: 15.0000 mg | ORAL_TABLET | Freq: Every day | ORAL | 0 refills | Status: DC

## 2023-04-03 NOTE — ED Provider Notes (Signed)
[place] [facility] DEPARTMENT AT [facility] Provider Note   CSN: [id number] Arrival date & time: 04/02/23  2244     History  Chief Complaint  Patient presents with   Back Pain    Jamie Christensen is a 37 y.o. female.  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  Felt a bit worse after she went home.  She took Tylenol and it seems to have improved a bit by now.  No history of IV drug use, diabetes, trauma, recent infections, paresthesias, weakness or incontinence.   Back Pain      Home Medications Prior to Admission medications   Medication Sig Start Date End Date Taking? Authorizing Provider  cyclobenzaprine (FLEXERIL) 10 MG tablet Take 1 tablet (10 mg total) by mouth 2 (two) times daily as needed for muscle spasms. 04/03/23  Yes Elza Sortor, Barbara Cower, MD  meloxicam (MOBIC) 15 MG tablet Take 1 tablet (15 mg total) by mouth daily. 04/03/23  Yes Halbert Jesson, Barbara Cower, MD  lisinopril (PRINIVIL,ZESTRIL) 20 MG tablet Take 5 mg by mouth daily.    [provider]  ondansetron (ZOFRAN ODT) 4 MG disintegrating tablet 4mg  ODT q4 hours prn nausea/vomit 03/27/17   Bethann Berkshire, MD      Allergies    Patient has no known allergies.    Review of Systems   Review of Systems  Musculoskeletal:  Positive for back pain.    Physical Exam Updated Vital Signs BP (!) 142/107 (BP Location: Left Arm)   Pulse 74   Temp 98.5 F (36.9 C) (Oral)   Resp 16   Ht 5\' 5"  (1.651 m)   Wt 104.3 kg   LMP 04/01/2023 (Exact Date)   SpO2 98%   BMI 38.27 kg/m  Physical Exam Vitals and nursing note reviewed.  Constitutional:      Appearance: She is well-developed.  HENT:     Head: Normocephalic and atraumatic.  Eyes:     Pupils: Pupils are equal, round, and reactive to light.  Cardiovascular:     Rate and Rhythm: Normal rate and regular rhythm.  Pulmonary:     Effort: No respiratory distress.      Breath sounds: No stridor.  Abdominal:     General: There is no distension.  Musculoskeletal:        General: Tenderness (bilateral paraspinal muscular tenderness) present.     Cervical back: Normal range of motion.  Skin:    General: Skin is warm and dry.  Neurological:     Mental Status: She is alert.     Comments: Symmetric and normal plantarflexion, dorsiflexion, leg raise and sensation to light touch in distal lower extremities no saddle anesthesia.     ED Results / Procedures / Treatments   Labs (all labs ordered are listed, but only abnormal results are displayed) Labs Reviewed - No data to display  EKG None  Radiology No results found.  Procedures Procedures    Medications Ordered in ED Medications  ketorolac (TORADOL) injection 30 mg (30 mg Intramuscular Given 04/03/23 0349)  HYDROmorphone (DILAUDID) injection 1 mg (1 mg Intramuscular Given 04/03/23 0353)  cyclobenzaprine (FLEXERIL) tablet 5 mg (5 mg Oral Given 04/03/23 0348)    ED Course/ Medical Decision Making/ A&P                                 Medical  Decision Making Risk Prescription drug management.   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  No trauma to suggest fracture.  Will treat symptomatically with PCP follow-up.  Final Clinical Impression(s) / ED Diagnoses Final diagnoses:  Acute low back pain without sciatica, unspecified back pain laterality    Rx / DC Orders ED Discharge Orders          Ordered    meloxicam (MOBIC) 15 MG tablet  Daily        04/03/23 0409    cyclobenzaprine (FLEXERIL) 10 MG tablet  2 times daily PRN        04/03/23 0409              Smt. Loder, Barbara Cower, MD 04/03/23 [phone]

## 2023-04-06 NOTE — Plan of Care (Signed)
[facility] Tonsillectomy/Adenoidectomy, Postoperative PEDS care plan entered in error.

## 2023-04-10 IMAGING — DX DG KNEE COMPLETE 4+V*L*
4 series · 4 of 4 positions shown · non-contrast
Comparison: None Available.

CLINICAL DATA: Bilateral knee pain for a week.  No injury.

EXAM:
LEFT KNEE - COMPLETE 4+ VIEW

[knee ap]
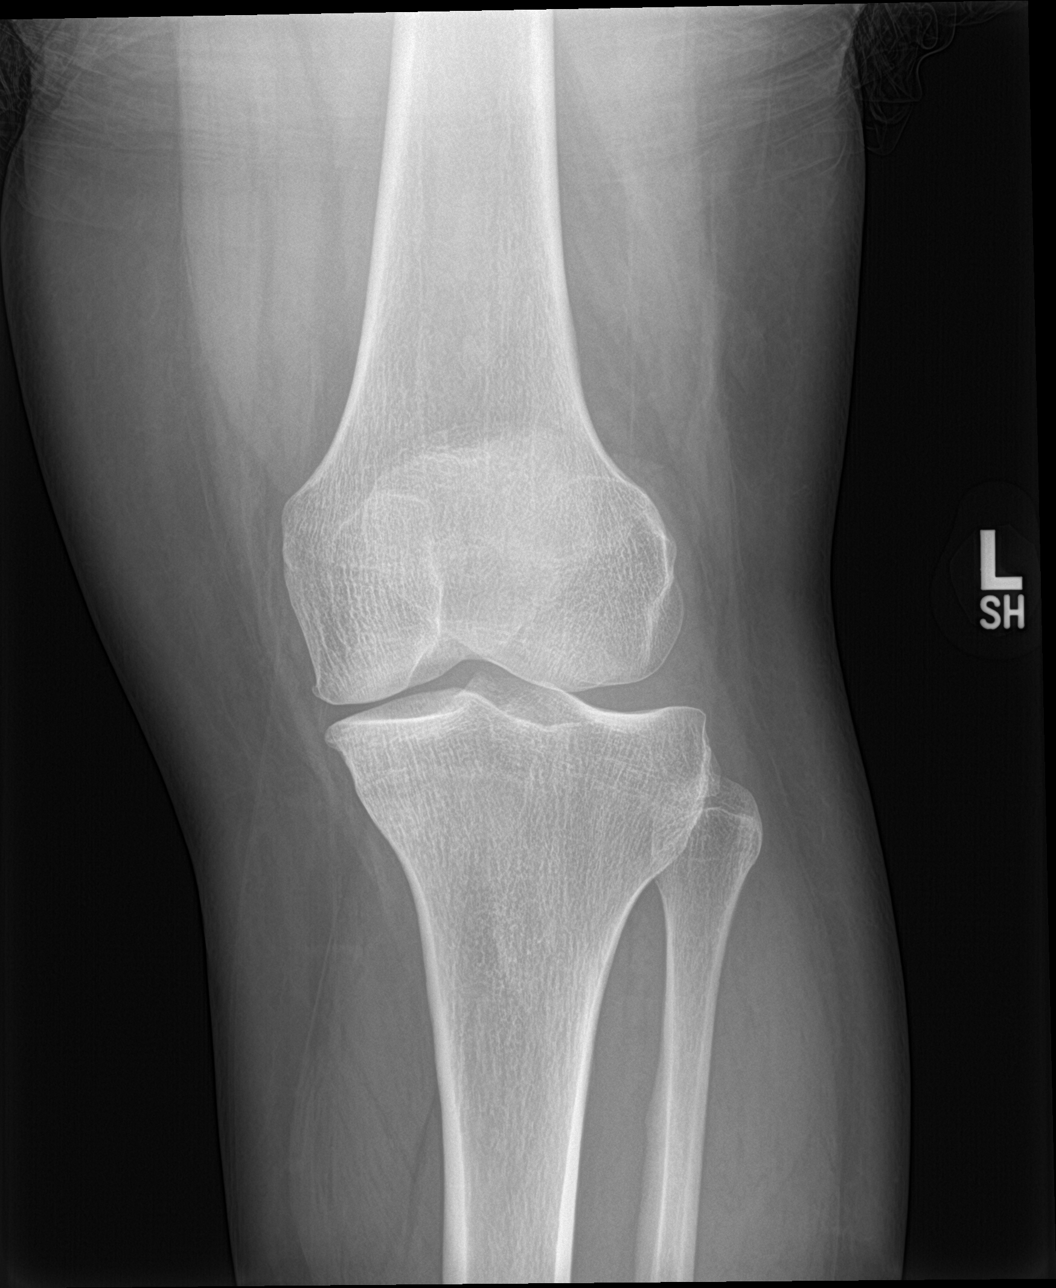

[knee lat]
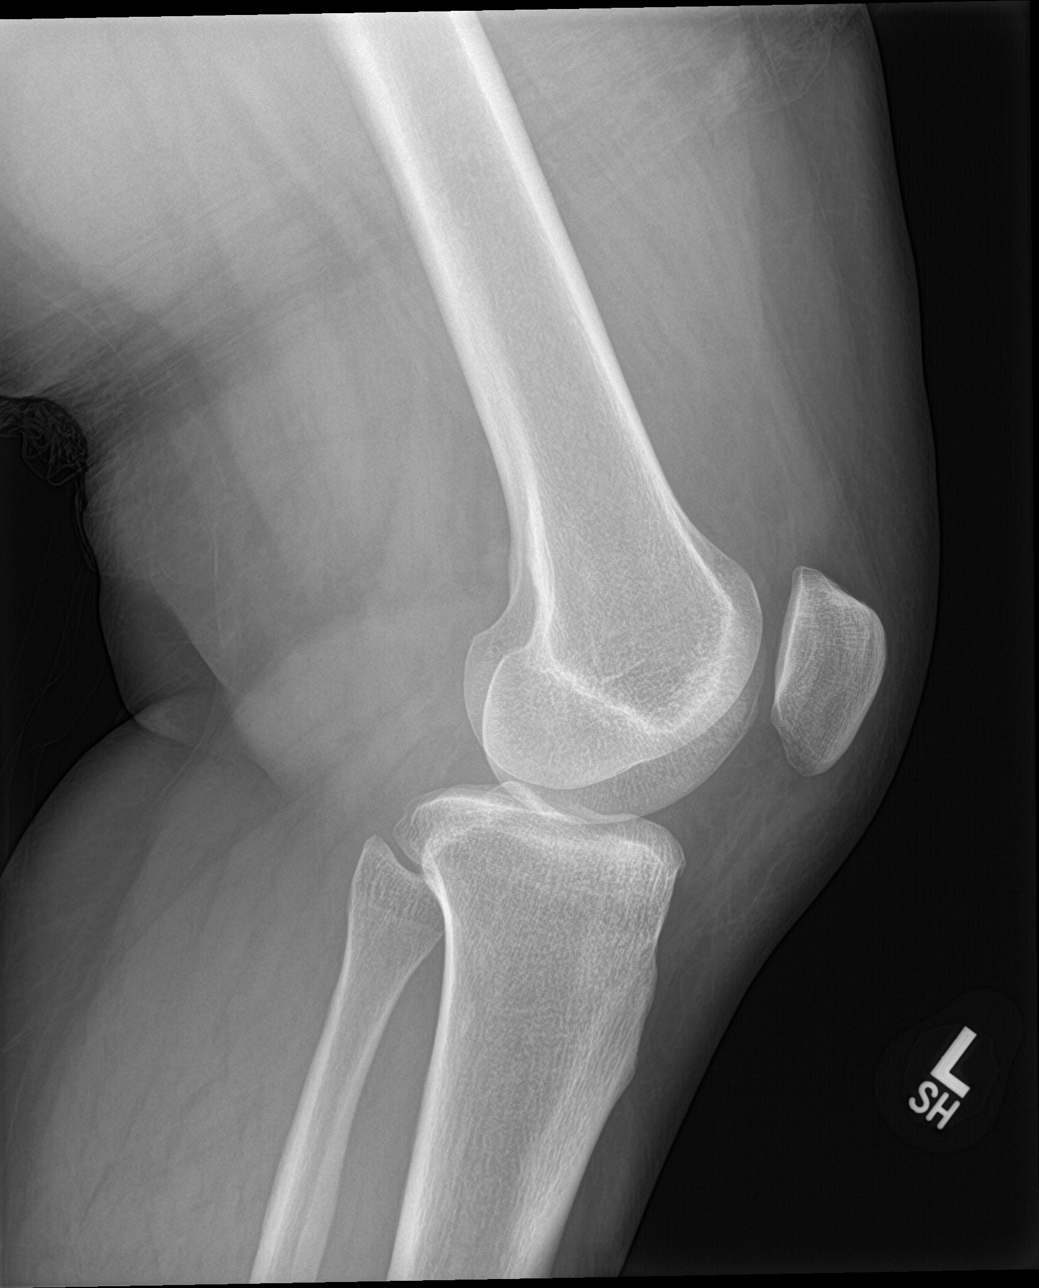

[knee obl (1 of 2)]
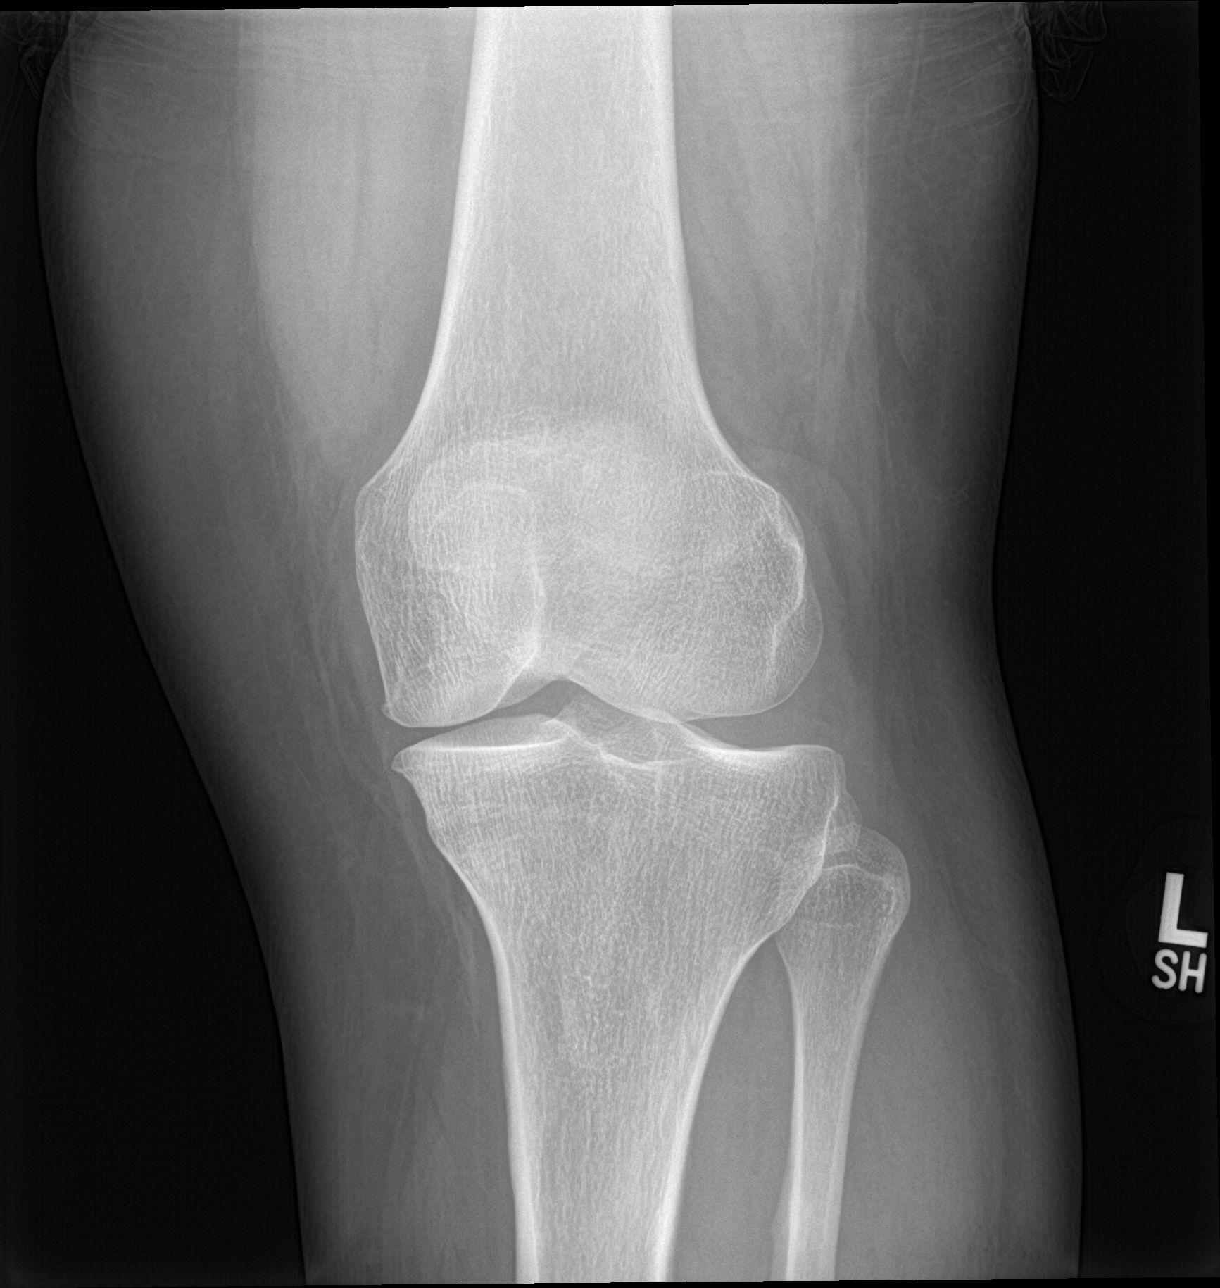

[knee obl (2 of 2)]
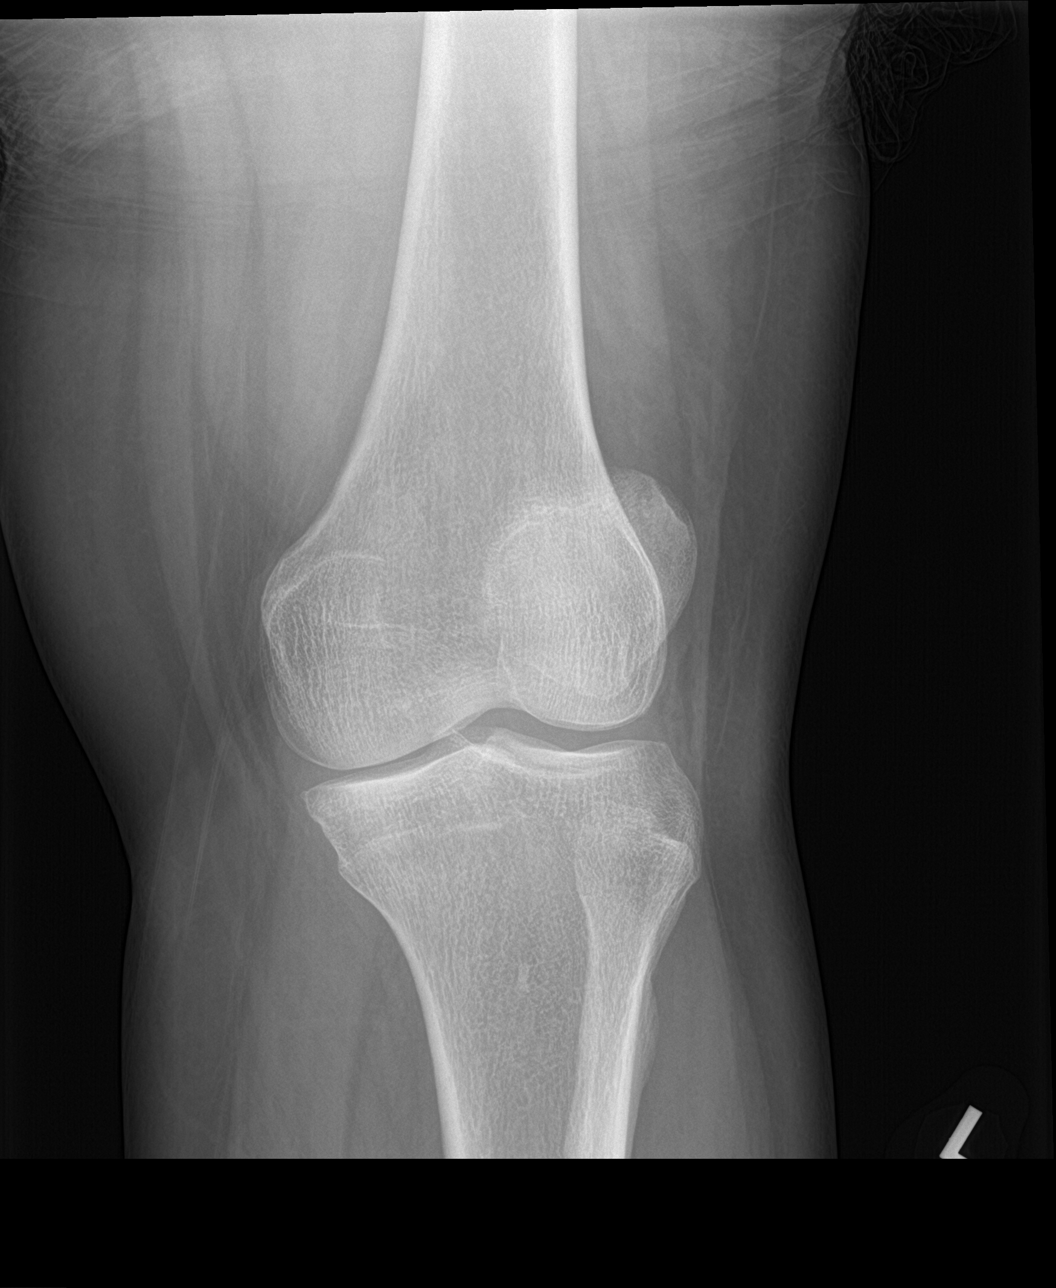

[4 of 4 positions shown; findings below may reference images not displayed]

FINDINGS: No fracture or bone lesion.

Mild narrowing of the medial joint space with small associated
marginal osteophytes. No other degenerative/arthropathic change.

No joint effusion.  Surrounding soft tissues are unremarkable.
IMPRESSION: 1. No fracture or acute finding.
2. Mild medial compartment osteoarthritis.

## 2024-01-04 ENCOUNTER — Emergency Department (HOSPITAL_BASED_OUTPATIENT_CLINIC_OR_DEPARTMENT_OTHER)

## 2024-01-04 ENCOUNTER — Emergency Department (HOSPITAL_BASED_OUTPATIENT_CLINIC_OR_DEPARTMENT_OTHER)
Admission: EM | Admit: 2024-01-04 | Discharge: 2024-01-04 | Disposition: A | Discharge status: Home / Self Care | Attending: Emergency Medicine | Admitting: Emergency Medicine

## 2024-01-04 ENCOUNTER — Encounter (HOSPITAL_BASED_OUTPATIENT_CLINIC_OR_DEPARTMENT_OTHER): Payer: Self-pay | Admitting: Urology

## 2024-01-04 DIAGNOSIS — R0789 Other chest pain: Secondary | ICD-10-CM | POA: Insufficient documentation

## 2024-01-04 DIAGNOSIS — Y92410 Unspecified street and highway as the place of occurrence of the external cause: Secondary | ICD-10-CM | POA: Diagnosis not present

## 2024-01-04 MED ORDER — ACETAMINOPHEN 500 MG PO TABS
1000.0000 mg | ORAL_TABLET | Freq: Once | ORAL | Status: AC
  Administered 2024-01-04: 1000 mg via ORAL
  Filled 2024-01-04: qty 2

## 2024-01-04 MED ORDER — NAPROXEN 500 MG PO TABS
500.0000 mg | ORAL_TABLET | Freq: Two times a day (BID) | ORAL | 0 refills | Status: AC
Start: 2024-01-04 — End: ?

## 2024-01-04 MED ORDER — METHOCARBAMOL 500 MG PO TABS
500.0000 mg | ORAL_TABLET | Freq: Two times a day (BID) | ORAL | 0 refills | Status: AC
Start: 2024-01-04 — End: ?
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
## 2024-01-04 NOTE — ED Notes (Signed)
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
## 2024-02-08 ENCOUNTER — Emergency Department (HOSPITAL_BASED_OUTPATIENT_CLINIC_OR_DEPARTMENT_OTHER)

## 2024-02-08 ENCOUNTER — Emergency Department (HOSPITAL_BASED_OUTPATIENT_CLINIC_OR_DEPARTMENT_OTHER): Admission: EM | Admit: 2024-02-08 | Discharge: 2024-02-08 | Disposition: A | Discharge status: Home / Self Care

## 2024-02-08 ENCOUNTER — Encounter (HOSPITAL_BASED_OUTPATIENT_CLINIC_OR_DEPARTMENT_OTHER): Payer: Self-pay

## 2024-02-08 ENCOUNTER — Other Ambulatory Visit: Payer: Self-pay

## 2024-02-08 DIAGNOSIS — Z79899 Other long term (current) drug therapy: Secondary | ICD-10-CM | POA: Insufficient documentation

## 2024-02-08 DIAGNOSIS — I10 Essential (primary) hypertension: Secondary | ICD-10-CM | POA: Insufficient documentation

## 2024-02-08 DIAGNOSIS — R519 Headache, unspecified: Secondary | ICD-10-CM | POA: Diagnosis present

## 2024-02-08 LAB — BASIC METABOLIC PANEL WITH GFR
Anion gap: 11 (ref 5–15)
BUN: 8 mg/dL (ref 6–20)
CO2: 24 mmol/L (ref 22–32)
Calcium: 9.4 mg/dL (ref 8.9–10.3)
Chloride: 103 mmol/L (ref 98–111)
Creatinine, Ser: 0.91 mg/dL (ref 0.44–1.00)
GFR, Estimated: >60 mL/min (ref >60)
Glucose, Bld: 92 mg/dL (ref 70–99)
Potassium: 3.7 mmol/L (ref 3.5–5.1)
Sodium: 138 mmol/L (ref 135–145)

## 2024-02-08 LAB — PREGNANCY, URINE: Preg Test, Ur: NEGATIVE

## 2024-02-08 MED ORDER — METOCLOPRAMIDE HCL 5 MG/ML IJ SOLN
10.0000 mg | Freq: Once | INTRAMUSCULAR | Status: AC
  Administered 2024-02-08: 10 mg via INTRAVENOUS
  Filled 2024-02-08: qty 2

## 2024-02-08 MED ORDER — DIPHENHYDRAMINE HCL 50 MG/ML IJ SOLN
25.0000 mg | Freq: Once | INTRAMUSCULAR | Status: AC
  Administered 2024-02-08: 25 mg via INTRAVENOUS
  Filled 2024-02-08: qty 1

## 2024-02-08 MED ORDER — AMLODIPINE BESYLATE 5 MG PO TABS
5.0000 mg | ORAL_TABLET | Freq: Every day | ORAL | 0 refills | Status: AC
Start: 2024-02-08 — End: 2024-03-09

## 2024-02-08 NOTE — Discharge Instructions (Addendum)
 Please read and follow all provided instructions.  Your diagnoses today include:  1. Acute nonintractable headache, unspecified headache type   2. Uncontrolled hypertension     Tests performed today include: CT of your head which was normal and did not show any serious cause of your headache Vital signs. See below for your results today.   Medications:  In the Emergency Department you received: Reglan  - antinausea/headache medication Benadryl  - antihistamine to counteract potential side effects of reglan   Take any prescribed medications only as directed.  Additional information:  Follow any educational materials contained in this packet.  You are having a headache. No specific cause was found today for your headache. It may have been a migraine or other cause of headache. Stress, anxiety, fatigue, and depression are common triggers for headaches.   Your headache today does not appear to be life-threatening or require hospitalization, but often the exact cause of headaches is not determined in the emergency department. Therefore, follow-up with your doctor is very important to find out what may have caused your headache and whether or not you need any further diagnostic testing or treatment.   Sometimes headaches can appear benign (not harmful), but then more serious symptoms can develop which should prompt an immediate re-evaluation by your doctor or the emergency department.  BE VERY CAREFUL not to take multiple medicines containing Tylenol  (also called acetaminophen ). Doing so can lead to an overdose which can damage your liver and cause liver failure and possibly death.   Follow-up instructions: Please follow-up with your primary care provider in the next 3 days for further evaluation of your symptoms.   Return instructions:  Please return to the Emergency Department if you experience worsening symptoms. Return if the medications do not resolve your headache, if it recurs, or if  you have multiple episodes of vomiting or cannot keep down fluids. Return if you have a change from the usual headache. RETURN IMMEDIATELY IF you: Develop a sudden, severe headache Develop confusion or become poorly responsive or faint Develop a fever above 100.58F or problem breathing Have a change in speech, vision, swallowing, or understanding Develop new weakness, numbness, tingling, incoordination in your arms or legs Have a seizure Please return if you have any other emergent concerns.  Additional Information:  Your vital signs today were: BP (!) 180/130   Pulse 69   Temp 98.6 F (37 C) (Oral)   Resp 17   Wt 104.3 kg   LMP 02/06/2024 (Approximate)   SpO2 99%   BMI 38.27 kg/m  If your blood pressure (BP) was elevated above 135/85 this visit, please have this repeated by your doctor within one month. --------------    Thank you for the opportunity to take care of you in our Emergency Department. You have been diagnosed with high blood pressure, also known as hypertension. This means that the force of blood against the walls of your blood vessels called is too strong. It also means that your heart has to work harder to move the blood. High blood pressure usually has no symptoms, but over time, it can cause serious health problems such as Heart attack and heart failure Stroke Kidney disease and failure Vision loss With the help from your healthcare provider and some important life style changes, you can manage your blood pressure and protect your health. Please read the instructions provided on hypertension, how to manage it and how to check your blood pressure. Additionally, use the blood pressure log provided to record your  blood pressures. Take the blood pressure log with you to your primary care doctor so that they can adjust your blood pressure medications if needed. Please read the instructions on follow-up appointment. Return to the ER or Call 911 right away if you have  any of these symptoms: Chest pain or shortness of breath Severe headache Weakness, tingling, or numbness of your face, arms, or legs (especially on 1 side of the body) Sudden change in vision Confusion, trouble speaking, or trouble understanding speech  Thank you for the opportunity to take care of you in our Emergency Department. You have been diagnosed with high blood pressure, also known as hypertension. This means that the force of blood against the walls of your blood vessels called is too strong. It also means that your heart has to work harder to move the blood. High blood pressure usually has no symptoms, but over time, it can cause serious health problems such as Heart attack and heart failure Stroke Kidney disease and failure Vision loss With the help from your healthcare provider and some important life style changes, you can manage your blood pressure and protect your health. Please read the instructions provided on hypertension, how to manage it and how to check your blood pressure. Additionally, use the blood pressure log provided to record your blood pressures. Take the blood pressure log with you to your primary care doctor so that they can adjust your blood pressure medications if needed. Please read the instructions on follow-up appointment. Return to the ER or Call 911 right away if you have any of these symptoms: Chest pain or shortness of breath Severe headache Weakness, tingling, or numbness of your face, arms, or legs (especially on 1 side of the body) Sudden change in vision Confusion, trouble speaking, or trouble understanding speech  Thank you for the opportunity to take care of you in our Emergency Department. You have been diagnosed with high blood pressure, also known as hypertension. This means that the force of blood against the walls of your blood vessels called is too strong. It also means that your heart has to work harder to move the blood. High blood  pressure usually has no symptoms, but over time, it can cause serious health problems such as Heart attack and heart failure Stroke Kidney disease and failure Vision loss With the help from your healthcare provider and some important life style changes, you can manage your blood pressure and protect your health. Please read the instructions provided on hypertension, how to manage it and how to check your blood pressure. Additionally, use the blood pressure log provided to record your blood pressures. Take the blood pressure log with you to your primary care doctor so that they can adjust your blood pressure medications if needed. Please read the instructions on follow-up appointment. Return to the ER or Call 911 right away if you have any of these symptoms: Chest pain or shortness of breath Severe headache Weakness, tingling, or numbness of your face, arms, or legs (especially on 1 side of the body) Sudden change in vision Confusion, trouble speaking, or trouble understanding speech

## 2024-02-08 NOTE — ED Provider Notes (Signed)
 [place] [facility] Provider Note   CSN: [id number] Arrival date & time: 02/08/24  0825     Patient presents with: Hypertension   Jamie Christensen is a 38 y.o. female.   Patient presents to the emergency department for evaluation of headache in setting of hypertension.  Patient states that she developed a headache this morning.  It is bilateral, frontal, lateral.  She awoke this morning with a headache.  It has gradually worsened.  Checked blood pressure is high into the 170s this morning.  States that she has had similar in the headaches in the past with high blood pressure.  Denies migraine history.  Denies current antihypertensives.  Denies falls or injuries.  Denies fevers, chills, confusion, neck pain or vomiting.  No chest pain or trouble breathing.  No vision change or loss.  She feels dizzy with standing, described as an off-balance, no lightheadedness or syncope.  She drove herself to the ED today.       Prior to Admission medications   Medication Sig Start Date End Date Taking? Authorizing Provider  lisinopril (PRINIVIL,ZESTRIL) 20 MG tablet Take 5 mg by mouth daily.    [provider]  methocarbamol  (ROBAXIN ) 500 MG tablet Take 1 tablet (500 mg total) by mouth 2 (two) times daily. 01/04/24   Henderly, Britni A, PA-C  naproxen  (NAPROSYN ) 500 MG tablet Take 1 tablet (500 mg total) by mouth 2 (two) times daily. 01/04/24   Henderly, Britni A, PA-C  ondansetron  (ZOFRAN  ODT) 4 MG disintegrating tablet 4mg  ODT q4 hours prn nausea/vomit 03/27/17   Zammit, Joseph, MD    Allergies: Patient has no known allergies.    Review of Systems  Updated Vital Signs BP (!) 180/130   Pulse 69   Temp 98.6 F (37 C) (Oral)   Resp 17   Wt 104.3 kg   LMP 02/06/2024 (Approximate)   SpO2 99%   BMI 38.27 kg/m   Physical Exam Vitals and nursing note reviewed.  Constitutional:      Appearance: She is well-developed.  HENT:     Head: Normocephalic  and atraumatic.     Right Ear: Tympanic membrane, ear canal and external ear normal.     Left Ear: Tympanic membrane, ear canal and external ear normal.     Nose: Nose normal.     Mouth/Throat:     Pharynx: Uvula midline.  Eyes:     General: Lids are normal.     Extraocular Movements:     Right eye: No nystagmus.     Left eye: No nystagmus.     Conjunctiva/sclera: Conjunctivae normal.     Pupils: Pupils are equal, round, and reactive to light.  Cardiovascular:     Rate and Rhythm: Normal rate and regular rhythm.  Pulmonary:     Effort: Pulmonary effort is normal.     Breath sounds: Normal breath sounds.  Abdominal:     Palpations: Abdomen is soft.     Tenderness: There is no abdominal tenderness.  Musculoskeletal:     Cervical back: Normal range of motion and neck supple. No tenderness or bony tenderness.  Skin:    General: Skin is warm and dry.  Neurological:     Mental Status: She is alert and oriented to person, place, and time.     GCS: GCS eye subscore is 4. GCS verbal subscore is 5. GCS motor subscore is 6.     Cranial Nerves: No cranial nerve deficit.  Sensory: No sensory deficit.     Motor: No weakness.     Coordination: Coordination normal.     Gait: Gait normal.     Comments: Upper extremity myotomes tested bilaterally:  C5 Shoulder abduction 5/5 C6 Elbow flexion/wrist extension 5/5 C7 Elbow extension 5/5 C8 Finger flexion 5/5 T1 Finger abduction 5/5  Lower extremity myotomes tested bilaterally: L2 Hip flexion 5/5 L3 Knee extension 5/5 L4 Ankle dorsiflexion 5/5 S1 Ankle plantar flexion 5/5      (all labs ordered are listed, but only abnormal results are displayed) Labs Reviewed  PREGNANCY, URINE  BASIC METABOLIC PANEL WITH GFR    EKG: EKG Interpretation Date/Time:  Monday February 08 2024 09:02:19 EDT Ventricular Rate:  67 PR Interval:  161 QRS Duration:  90 QT Interval:  392 QTC Calculation: 414 R Axis:   6  Text Interpretation: Sinus  rhythm Confirmed by Neysa Clap [phone]) on 02/08/2024 9:43:16 AM  Radiology: CT Head Wo Contrast Result Date: 02/08/2024 CLINICAL DATA:  38 year old female with headache, dizziness, hypertensive. EXAM: CT HEAD WITHOUT CONTRAST TECHNIQUE: Contiguous axial images were obtained from the base of the skull through the vertex without intravenous contrast. RADIATION DOSE REDUCTION: This exam was performed according to the departmental dose-optimization program which includes automated exposure control, adjustment of the mA and/or kV according to patient size and/or use of iterative reconstruction technique. COMPARISON:  None Available. FINDINGS: Brain: Normal cerebral volume. No midline shift, ventriculomegaly, mass effect, evidence of mass lesion, intracranial hemorrhage or evidence of cortically based acute infarction. Gray-white matter differentiation is within normal limits throughout the brain. Vascular: No suspicious intracranial vascular hyperdensity. Skull: Intact, negative. Sinuses/Orbits: Tympanic cavities, Visualized paranasal sinuses and mastoids are clear. Other: Visualized orbits and scalp soft tissues are within normal limits. IMPRESSION: Normal noncontrast Head CT. Electronically Signed   By: VEAR Hurst M.D.   On: 02/08/2024 10:30     Procedures   Medications Ordered in the ED  metoCLOPramide  (REGLAN ) injection 10 mg (has no administration in time range)  diphenhydrAMINE  (BENADRYL ) injection 25 mg (has no administration in time range)   ED Course  Patient seen and examined. History obtained directly from patient.   Labs/EKG: Ordered BMP as we will likely start BP meds.  Imaging: Ordered CT head for atypical headache  Medications/Fluids: Ordered: IV Reglan /Benadryl   Most recent vital signs reviewed and are as follows: BP (!) 180/130   Pulse 69   Temp 98.6 F (37 C) (Oral)   Resp 17   Wt 104.3 kg   LMP 02/06/2024 (Approximate)   SpO2 99%   BMI 38.27 kg/m   Initial impression:  Headache in setting of moderately elevated hypertension.  Reassuring neuro exam.  Low concern for subarachnoid bleed, head trauma, meningitis.  10:47 AM Reassessment performed. Patient appears stable, comfortable.  Family member at bedside.  She states that she is feeling better, still with some headache, but improved.  Labs personally reviewed and interpreted including: BMP was unremarkable with normal kidney function; pregnancy negative.  Imaging personally visualized and interpreted including: CT head, agree negative.  Reviewed pertinent lab work and imaging with patient at bedside. Questions answered.   Most current vital signs reviewed and are as follows: BP (!) 150/94   Pulse 70   Temp 98.6 F (37 C) (Oral)   Resp 16   Wt 104.3 kg   LMP 02/06/2024 (Approximate)   SpO2 100%   BMI 38.27 kg/m   Plan: Discharge to home.   Prescriptions written for: Amlodipine  5  mg p.o.  Other home care instructions discussed: Rest and avoidance of triggers, checking blood pressure at home at the same time once per day, keeping a log.  ED return instructions discussed: Patient counseled to return if they have weakness in their arms or legs, slurred speech, trouble walking or talking, confusion, trouble with their balance, or if they have any other concerns. Patient verbalizes understanding and agrees with plan.   Follow-up instructions discussed: Patient encouraged to follow-up with their PCP in 7 days.                                    Medical Decision Making Amount and/or Complexity of Data Reviewed Labs: ordered. Radiology: ordered.  Risk Prescription drug management.   In regards to the patient's headache, critical differentials were considered including subarachnoid hemorrhage, intracerebral hemorrhage, epidural/subdural hematoma, pituitary apoplexy, vertebral/carotid artery dissection, giant cell arteritis, central venous thrombosis, reversible cerebral vasoconstriction, acute  angle closure glaucoma, idiopathic intracranial hypertension, bacterial meningitis, viral encephalitis, carbon monoxide poisoning, posterior reversible encephalopathy syndrome, pre-eclampsia.   Reg flag symptoms related to these causes were considered including systemic symptoms (fever, weight loss), neurologic symptoms (confusion, mental status change, vision change, associated seizure), acute or sudden thunderclap onset, patient age 2 or older with new or progressive headache, patient of any age with first headache or change in headache pattern, pregnant or postpartum status, history of HIV or other immunocompromise, history of cancer, headache occurring with exertion, associated neck or shoulder pain, associated traumatic injury, concurrent use of anticoagulation, family history of spontaneous SAH, and concurrent drug use.    Other benign, more common causes of headache were considered including migraine, tension-type headache, cluster headache, referred pain from other cause such as sinus infection, dental pain, trigeminal neuralgia.   On exam, patient has a reassuring neuro exam including baseline mental status, no significant neck pain or meningeal signs, no signs of severe infection or fever.   In regards to hypertension, no obvious signs of endorgan damage today.  Head CT was negative.  No vision changes.  No chest pain or shortness of breath.  Normal creatinine.  No concerns for hypertensive emergency at this time.  The patient's vital signs, pertinent lab work and imaging were reviewed and interpreted as discussed in the ED course. Hospitalization was considered for further testing, treatments, or serial exams/observation. However as patient is well-appearing, has a stable exam over the course of their evaluation, and reassuring studies today, I do not feel that they warrant admission at this time. This plan was discussed with the patient who verbalizes agreement and comfort with this plan and  seems reliable and able to return to the Emergency Department with worsening or changing symptoms.       Final diagnoses:  Acute nonintractable headache, unspecified headache type  Uncontrolled hypertension    ED Discharge Orders          Ordered    amLODipine  (NORVASC ) 5 MG tablet  Daily        02/08/24 1046    Referral to [facility] Care Management       Comments: Pharmacy Medication Management for Uncontrolled hypertension in the ED   02/08/24 1046    amLODipine  (NORVASC ) 5 MG tablet  Daily        Pending    Referral to [facility] Care Management       Comments: Pharmacy Medication Management for Uncontrolled hypertension in the ED  Pending               Desiderio Chew, PA-C 02/08/24 1049    Neysa Caron PARAS, [place] 02/08/24 1510

## 2024-02-08 NOTE — ED Triage Notes (Signed)
 Reports BP systolic in 170's this morning. Dizziness and headache starting this morning.   Denies chest pain or SHOB

## 2024-02-08 NOTE — ED Notes (Signed)
 Asked pt for urine sample. Stated that she did not have the urge to use bathroom at the time. Will check later for to obtain sample.

## 2024-02-09 ENCOUNTER — Telehealth: Payer: Self-pay | Admitting: *Deleted

## 2024-02-09 NOTE — Progress Notes (Unsigned)
 Care Guide Pharmacy Note  02/09/2024 Name: Sanaia Jasso MRN: [id number] DOB: [birth year]  Referred By: Patient, No Pcp Per Reason for referral: Call Attempt #1 and Complex Care Management (Outreach to schedule referral with pharmacist )   Shavonta Gossen is a 38 y.o. year old female who is a primary care patient of Patient, No Pcp Per.  Rojean Ige was referred to the pharmacist for assistance related to: HTN  An unsuccessful telephone outreach was attempted today to contact the patient who was referred to the pharmacy team for assistance with medication management. Additional attempts will be made to contact the patient.  Thedford Franks, CMA   [facility], [facility] Guide Direct Dial: [phone]  Fax: [phone] Website: [email or web address]

## 2024-02-10 NOTE — Progress Notes (Unsigned)
 Care Guide Pharmacy Note  02/10/2024 Name: Jamie Christensen MRN: [id number] DOB: 04-25-1986  Referred By: Patient, No Pcp Per Reason for referral: Call Attempt #1 and Complex Care Management (Outreach to schedule referral with pharmacist )   Jamie Christensen is a 38 y.o. year old female who is a primary care patient of Patient, No Pcp Per.  Jamie Christensen was referred to the pharmacist for assistance related to: HTN  A second unsuccessful telephone outreach was attempted today to contact the patient who was referred to the pharmacy team for assistance with medication management. Additional attempts will be made to contact the patient.  Jamie Christensen, CMA [place]  [facility], [facility] Guide Direct Dial: [phone]  Fax: [phone] Website: [email or web address]

## 2024-02-11 NOTE — Progress Notes (Signed)
 Care Guide Pharmacy Note  02/11/2024 Name: Jamie Christensen MRN: [id number] DOB: 1986-01-21  Referred By: Patient, No Pcp Per Reason for referral: Call Attempt #1 and Complex Care Management (Outreach to schedule referral with pharmacist )   Jamie Christensen is a 38 y.o. year old female who is a primary care patient of Patient, No Pcp Per.  Jamie Christensen was referred to the pharmacist for assistance related to: HTN  A third unsuccessful telephone outreach was attempted today to contact the patient who was referred to the pharmacy team for assistance with medication management. The Population Health team is pleased to engage with this patient at any time in the future upon receipt of referral and should he/she be interested in assistance from the Population Health team.  Jamie Christensen, CMA [facility]  [facility], [facility] Guide Direct Dial: [phone]  Fax: [phone] Website: [email or web address]
